# Patient Record
Sex: Female | Born: 1955 | Race: White | Hispanic: No | Smoking: Former smoker
Health system: Southern US, Community
[De-identification: ages and names within clinical notes are randomized; demographics above are authoritative.]

## PROBLEM LIST (undated history)

## (undated) DIAGNOSIS — I4891 Unspecified atrial fibrillation: Secondary | ICD-10-CM

## (undated) DIAGNOSIS — I10 Essential (primary) hypertension: Secondary | ICD-10-CM

## (undated) DIAGNOSIS — J40 Bronchitis, not specified as acute or chronic: Secondary | ICD-10-CM

## (undated) HISTORY — PX: TUBAL LIGATION: SHX77

## (undated) HISTORY — PX: OTHER SURGICAL HISTORY: SHX169

## (undated) HISTORY — PX: BREAST REDUCTION SURGERY: SHX8

---

## 1998-07-02 ENCOUNTER — Other Ambulatory Visit: Admission: RE | Admit: 1998-07-02 | Discharge: 1998-07-02 | Payer: Self-pay | Admitting: Obstetrics and Gynecology

## 1999-04-19 ENCOUNTER — Other Ambulatory Visit: Admission: RE | Admit: 1999-04-19 | Discharge: 1999-04-19 | Payer: Self-pay | Admitting: Obstetrics and Gynecology

## 2000-04-06 ENCOUNTER — Other Ambulatory Visit: Admission: RE | Admit: 2000-04-06 | Discharge: 2000-04-06 | Payer: Self-pay | Admitting: Obstetrics and Gynecology

## 2001-05-03 ENCOUNTER — Other Ambulatory Visit: Admission: RE | Admit: 2001-05-03 | Discharge: 2001-05-03 | Payer: Self-pay | Admitting: Obstetrics and Gynecology

## 2002-05-09 ENCOUNTER — Other Ambulatory Visit: Admission: RE | Admit: 2002-05-09 | Discharge: 2002-05-09 | Payer: Self-pay | Admitting: Obstetrics and Gynecology

## 2003-05-28 ENCOUNTER — Other Ambulatory Visit: Admission: RE | Admit: 2003-05-28 | Discharge: 2003-05-28 | Payer: Self-pay | Admitting: Obstetrics and Gynecology

## 2003-05-29 ENCOUNTER — Other Ambulatory Visit: Admission: RE | Admit: 2003-05-29 | Discharge: 2003-05-29 | Payer: Self-pay | Admitting: Obstetrics and Gynecology

## 2004-05-29 ENCOUNTER — Other Ambulatory Visit: Admission: RE | Admit: 2004-05-29 | Discharge: 2004-05-29 | Payer: Self-pay | Admitting: Obstetrics and Gynecology

## 2004-06-23 ENCOUNTER — Emergency Department (HOSPITAL_COMMUNITY): Admission: EM | Admit: 2004-06-23 | Discharge: 2004-06-23 | Payer: Self-pay | Admitting: Emergency Medicine

## 2005-05-30 ENCOUNTER — Other Ambulatory Visit: Admission: RE | Admit: 2005-05-30 | Discharge: 2005-05-30 | Payer: Self-pay | Admitting: Obstetrics and Gynecology

## 2006-06-08 ENCOUNTER — Other Ambulatory Visit: Admission: RE | Admit: 2006-06-08 | Discharge: 2006-06-08 | Payer: Self-pay | Admitting: Obstetrics and Gynecology

## 2008-03-04 ENCOUNTER — Emergency Department (HOSPITAL_COMMUNITY): Admission: EM | Admit: 2008-03-04 | Discharge: 2008-03-04 | Payer: Self-pay | Admitting: Emergency Medicine

## 2010-06-11 ENCOUNTER — Inpatient Hospital Stay (HOSPITAL_COMMUNITY)
Admission: EM | Admit: 2010-06-11 | Discharge: 2010-06-14 | DRG: 580 | Disposition: A | Discharge status: Home / Self Care | Attending: Orthopedic Surgery | Admitting: Orthopedic Surgery

## 2010-06-11 ENCOUNTER — Emergency Department (HOSPITAL_COMMUNITY)

## 2010-06-11 DIAGNOSIS — L03019 Cellulitis of unspecified finger: Principal | ICD-10-CM | POA: Diagnosis present

## 2010-06-11 DIAGNOSIS — M65839 Other synovitis and tenosynovitis, unspecified forearm: Secondary | ICD-10-CM | POA: Diagnosis present

## 2010-06-11 DIAGNOSIS — IMO0001 Reserved for inherently not codable concepts without codable children: Secondary | ICD-10-CM | POA: Diagnosis present

## 2010-06-11 DIAGNOSIS — L02519 Cutaneous abscess of unspecified hand: Principal | ICD-10-CM | POA: Diagnosis present

## 2010-06-11 DIAGNOSIS — I891 Lymphangitis: Secondary | ICD-10-CM | POA: Diagnosis present

## 2010-06-11 DIAGNOSIS — A280 Pasteurellosis: Secondary | ICD-10-CM | POA: Diagnosis present

## 2010-06-11 DIAGNOSIS — S61209A Unspecified open wound of unspecified finger without damage to nail, initial encounter: Secondary | ICD-10-CM | POA: Diagnosis present

## 2010-06-11 LAB — BASIC METABOLIC PANEL
CO2: 27 mEq/L (ref 19–32)
Chloride: 103 mEq/L (ref 96–112)
Creatinine, Ser: 0.93 mg/dL (ref 0.4–1.2)
GFR calc Af Amer: >60 mL/min (ref >60)
GFR calc non Af Amer: >60 mL/min (ref >60)
Potassium: 3.9 mEq/L (ref 3.5–5.1)

## 2010-06-11 LAB — CBC
HCT: 40.3 % (ref 36.0–46.0)
Hemoglobin: 14.2 g/dL (ref 12.0–15.0)
MCH: 29.7 pg (ref 26.0–34.0)
MCV: 84.3 fL (ref 78.0–100.0)
Platelets: 249 10*3/uL (ref 150–400)
WBC: 13.2 10*3/uL — ABNORMAL HIGH (ref 4.0–10.5)

## 2010-06-11 LAB — DIFFERENTIAL
Eosinophils Relative: 1 % (ref 0–5)
Lymphocytes Relative: 17 % (ref 12–46)
Monocytes Absolute: 1 10*3/uL (ref 0.1–1.0)
Monocytes Relative: 7 % (ref 3–12)
Neutrophils Relative %: 75 % (ref 43–77)

## 2010-06-12 LAB — BASIC METABOLIC PANEL
CO2: 25 mEq/L (ref 19–32)
Calcium: 8.7 mg/dL (ref 8.4–10.5)
Chloride: 105 mEq/L (ref 96–112)
Creatinine, Ser: 1.1 mg/dL (ref 0.4–1.2)
GFR calc Af Amer: >60 mL/min (ref >60)
Glucose, Bld: 181 mg/dL — ABNORMAL HIGH (ref 70–99)
Potassium: 3.9 mEq/L (ref 3.5–5.1)
Sodium: 137 mEq/L (ref 135–145)

## 2010-06-12 LAB — CBC
HCT: 37.2 % (ref 36.0–46.0)
Hemoglobin: 12.4 g/dL (ref 12.0–15.0)

## 2010-06-12 LAB — GRAM STAIN

## 2010-06-13 LAB — BASIC METABOLIC PANEL
CO2: 27 mEq/L (ref 19–32)
Calcium: 8.4 mg/dL (ref 8.4–10.5)
GFR calc Af Amer: >60 mL/min (ref >60)
Glucose, Bld: 128 mg/dL — ABNORMAL HIGH (ref 70–99)
Potassium: 3.9 mEq/L (ref 3.5–5.1)
Sodium: 142 mEq/L (ref 135–145)

## 2010-06-13 LAB — CBC
HCT: 37 % (ref 36.0–46.0)
Hemoglobin: 12.1 g/dL (ref 12.0–15.0)
MCHC: 32.7 g/dL (ref 30.0–36.0)
MCV: 86.4 fL (ref 78.0–100.0)

## 2010-06-14 LAB — WOUND CULTURE

## 2010-06-14 NOTE — Op Note (Signed)
  Brandi Stanton, Brandi Stanton              ACCOUNT NO.:  [id number]  MEDICAL RECORD NO.:  [id number]           PATIENT TYPE:  E  LOCATION:  [facility]                         FACILITY:  [facility]  PHYSICIAN:  Dionne Ano. Morayo Leven, M.D.DATE OF BIRTH:  April 20, 1956  DATE OF PROCEDURE:  06/11/2010 DATE OF DISCHARGE:                              OPERATIVE REPORT   PREOPERATIVE DIAGNOSIS:  Cat bite with purulent extensor tenosynovitis, right index finger.  POSTOPERATIVE DIAGNOSIS:  Cat bite with purulent extensor tenosynovitis, right index finger.  PROCEDURE: 1. Incision and drainage of deep abscess x2 including skin,     subcutaneous tissue and tendon sheath about the right index finger. 2. Extensor tenosynovectomy, extensive in nature, right index finger     and hand secondary to infection.  SURGEON:  Dionne Ano. Amanda Pea, MD  ASSISTANT:  None.  COMPLICATIONS:  None.  ANESTHESIA:  Intermetacarpal block.  TOURNIQUET TIME:  Less than an hour.  CULTURES:  One.  DRAINS:  Multiple.  INDICATIONS FOR PROCEDURE:  A very pleasant female with infected cat bite.  I have discussed the risks and benefits of surgery and she desires to proceed.  She has purulent extensor tenosynovitis, significant abnormality and ascending lymphangitis.  She been placed on ciprofloxacin 400 mg IV q.12 and Flagyl 500 mg q.8 h.  I have discussed the risks and benefits and she desires to proceed.  DESCRIPTION OF PROCEDURE:  The patient was given intermetacarpal block. Following this, prepped and draped in usual sterile fashion, Betadine scrub and paint x2, followed by Hibiclens scrub.  Once this was done, outline marks were made, I and D of 2 separate deep abscesses was accomplished.  Skin, subcutaneous tissue, tendon and tendon sheath tissue underwent I and D.  This was an excisional debridement with curette, knife, blade, and orthopedic instrument.  Following this, I then performed extensive extensor  tenosynovectomy secondary to inflamed tenosynovium.  The patient tolerated this well. This was removed without difficulty.  Cultures were taken.  Greater than 2 L of irrigant was placed through the wound and following this, it was packed with iodoform.  Tourniquet time was less than 30 minutes.  She had good refill at the conclusion of procedure, was dressed sterilely and will be admitted for IV antibiotics.  She will be on Cipro and Flagyl given the fact that she is pan-allergic.  We will also plan for elevation, finger range of motion, close observation and daily therapy for Water Pik and packing of the wound.  We are going to await cultures and place her on specific p.o. antibiotics according to the cultures as they return.  I have discussed with her relevant issues, do's and do not's, etc., and all questions have been encouraged and answered.     Dionne Ano. Amanda Pea, M.D.     [facility]  D:  06/11/2010  T:  06/12/2010  Job:  [id number]  Electronically Signed by Dominica Severin M.D. on 06/14/2010 05:54:51 AM

## 2011-10-04 ENCOUNTER — Emergency Department (HOSPITAL_COMMUNITY)
Admission: EM | Admit: 2011-10-04 | Discharge: 2011-10-04 | Disposition: A | Discharge status: Home / Self Care | Attending: Emergency Medicine | Admitting: Emergency Medicine

## 2011-10-04 ENCOUNTER — Encounter (HOSPITAL_COMMUNITY): Payer: Self-pay | Admitting: Emergency Medicine

## 2011-10-04 DIAGNOSIS — F17200 Nicotine dependence, unspecified, uncomplicated: Secondary | ICD-10-CM | POA: Insufficient documentation

## 2011-10-04 DIAGNOSIS — L259 Unspecified contact dermatitis, unspecified cause: Secondary | ICD-10-CM | POA: Insufficient documentation

## 2011-10-04 DIAGNOSIS — I10 Essential (primary) hypertension: Secondary | ICD-10-CM | POA: Insufficient documentation

## 2011-10-04 DIAGNOSIS — L309 Dermatitis, unspecified: Secondary | ICD-10-CM

## 2011-10-04 HISTORY — DX: Essential (primary) hypertension: I10

## 2011-10-04 MED ORDER — DIPHENHYDRAMINE HCL 25 MG PO CAPS
25.0000 mg | ORAL_CAPSULE | Freq: Once | ORAL | Status: AC
  Administered 2011-10-04: 25 mg via ORAL
  Filled 2011-10-04: qty 1

## 2011-10-04 MED ORDER — PREDNISONE 10 MG PO TABS: 40.0000 mg | ORAL_TABLET | Freq: Every day | ORAL | Status: DC

## 2011-10-04 MED ORDER — PREDNISONE 20 MG PO TABS
60.0000 mg | ORAL_TABLET | Freq: Once | ORAL | Status: AC
  Administered 2011-10-04: 60 mg via ORAL
  Filled 2011-10-04: qty 3

## 2011-10-04 MED ORDER — TRIAMCINOLONE ACETONIDE 0.1 % EX CREA: TOPICAL_CREAM | Freq: Two times a day (BID) | CUTANEOUS | Status: AC

## 2011-10-04 NOTE — ED Provider Notes (Signed)
Medical screening examination/treatment/procedure(s) were performed by non-physician practitioner and as supervising physician I was immediately available for consultation/collaboration.   Jahad Old, MD 10/04/11 1537 

## 2011-10-04 NOTE — ED Notes (Signed)
Pt reports  2 months hx of redness, itching over arms, groin area,behind knees. Pt has never been evaluated for this, reports same symptoms every April, Pt treated with topical creams only

## 2011-10-04 NOTE — ED Provider Notes (Signed)
History     CSN: [id number]  Arrival date & time 10/04/11  0847   First MD Initiated Contact with Patient 10/04/11 [phone]      Chief Complaint  Patient presents with  . Rash    red, raised itching rash in groin, arms, back of knees    (Consider location/radiation/quality/duration/timing/severity/associated sxs/prior treatment) HPI Comments: Patient reports she gets dermatitis every April x 20 years.  States she treats this at home with topical alcohol, vinegar, and betadine.  This year it has gotten much worse and has spread up both of her arms, involving mostly her antecubital fossae and axillae, it is also involving her hairline and neck, areas behind her knees and in her groin.  This has been present x 2 months.   Denies fever, chills, opening of the skin.  No pain involved.  No discharge.    The history is provided by the patient.    Past Medical History  Diagnosis Date  . Hypertension     Past Surgical History  Procedure Date  . Breast reduction surgery   . Other surgical history     LEAP procedure    Family History  Problem Relation Age of Onset  . Diabetes Mother   . Hypertension Mother     History  Substance Use Topics  . Smoking status: Current Everyday Smoker    Types: Cigarettes  . Smokeless tobacco: Not on file  . Alcohol Use: Yes    OB History    Grav Para Term Preterm Abortions TAB SAB Ect Mult Living                  Review of Systems  Constitutional: Negative for fever and chills.  HENT: Negative for sore throat and trouble swallowing.   Skin: Positive for rash. Negative for pallor and wound.    Allergies  Penicillins  Home Medications   Current Outpatient Rx  Name Route Sig Dispense Refill  . BIOTIN 5000 MCG PO TABS Oral Take 2 tablets by mouth daily.    Marland Kitchen CALCIUM CARBONATE-VITAMIN D 500-200 MG-UNIT PO TABS Oral Take 1 tablet by mouth daily.    . OMEGA-3 FATTY ACIDS 1000 MG PO CAPS Oral Take 2 g by mouth daily.    Marland Kitchen FOLIC ACID 800 MCG  PO TABS Oral Take 3,600 mcg by mouth daily.    . WOMENS 50+ ADVANCED PO CAPS Oral Take 1 capsule by mouth daily.      BP 182/95  Pulse 91  Temp(Src) 98.2 F (36.8 C) (Oral)  Resp 18  SpO2 99%  Physical Exam  Nursing note and vitals reviewed. Constitutional: She appears well-developed and well-nourished. No distress.  HENT:  Head: Normocephalic and atraumatic.  Neck: Neck supple.  Pulmonary/Chest: Effort normal.  Musculoskeletal: Normal range of motion. She exhibits no tenderness.       Distal pulses intact in all extremities.   Neurological: She is alert.  Skin: She is not diaphoretic.       Dry erythematous rash with lichenification extends over most flexor surfaces.  No warmth, discharge, tenderness.     ED Course  Procedures (including critical care time)  Labs Reviewed - No data to display No results found.   1. Dermatitis       MDM  Patient with diffuse dermatitis, likely eczema.  As she has annual flare up I suspect it is allergy related.  Pt also with what appears to be a cyst on her left foot, no signs of infection.  Pt given podiatry and derm follow up.  Pt d/c home with prednisone x 2 weeks, triamcinolone cream, benadryl for itching.  Return precautions given.  Patient verbalizes understanding and agrees with plan.         Dillard Cannon [place], [place] 10/04/11 [phone]

## 2011-10-04 NOTE — Discharge Instructions (Signed)
Read the information below.  If you develop fevers, pain, swelling, pus coming from your skin, return to the ER immediately for a recheck. You may return to the ER at any time for worsening condition or any new symptoms that concern you.  Rash A rash is a change in the color or texture of your skin. There are many different types of rashes. You may have other problems that accompany your rash. CAUSES   Infections.   Allergic reactions. This can include allergies to pets or foods.   Certain medicines.   Exposure to certain chemicals, soaps, or cosmetics.   Heat.   Exposure to poisonous plants.   Tumors, both cancerous and noncancerous.  SYMPTOMS   Redness.   Scaly skin.   Itchy skin.   Dry or cracked skin.   Bumps.   Blisters.   Pain.  DIAGNOSIS  Your caregiver may do a physical exam to determine what type of rash you have. A skin sample (biopsy) may be taken and examined under a microscope. TREATMENT  Treatment depends on the type of rash you have. Your caregiver may prescribe certain medicines. For serious conditions, you may need to see a skin doctor (dermatologist). HOME CARE INSTRUCTIONS   Avoid the substance that caused your rash.   Do not scratch your rash. This can cause infection.   You may take cool baths to help stop itching.   Only take over-the-counter or prescription medicines as directed by your caregiver.   Keep all follow-up appointments as directed by your caregiver.  SEEK IMMEDIATE MEDICAL CARE IF:  You have increasing pain, swelling, or redness.   You have a fever.   You have new or severe symptoms.   You have body aches, diarrhea, or vomiting.   Your rash is not better after 3 days.  MAKE SURE YOU:  Understand these instructions.   Will watch your condition.   Will get help right away if you are not doing well or get worse.  Document Released: 04/04/2002 Document Revised: 04/03/2011 Document Reviewed: 01/27/2011 [facility] Patient  Information 2012 [place], [place].  Eczema Atopic dermatitis, or eczema, is an inherited type of sensitive skin. Often people with eczema have a family history of allergies, asthma, or hay fever. It causes a red itchy rash and dry scaly skin. The itchiness may occur before the skin rash and may be very intense. It is not contagious. Eczema is generally worse during the cooler winter months and often improves with the warmth of summer. Eczema usually starts showing signs in infancy. Some children outgrow eczema, but it may last through adulthood. Flare-ups may be caused by:  Eating something or contact with something you are sensitive or allergic to.   Stress.  DIAGNOSIS  The diagnosis of eczema is usually based upon symptoms and medical history. TREATMENT  Eczema cannot be cured, but symptoms usually can be controlled with treatment or avoidance of allergens (things to which you are sensitive or allergic to).  Controlling the itching and scratching.   Use over-the-counter antihistamines as directed for itching. It is especially useful at night when the itching tends to be worse.   Use over-the-counter steroid creams as directed for itching.   Scratching makes the rash and itching worse and may cause impetigo (a skin infection) if fingernails are contaminated (dirty).   Keeping the skin well moisturized with creams every day. This will seal in moisture and help prevent dryness. Lotions containing alcohol and water can dry the skin and are not  recommended.   Limiting exposure to allergens.   Recognizing situations that cause stress.   Developing a plan to manage stress.  HOME CARE INSTRUCTIONS   Take prescription and over-the-counter medicines as directed by your caregiver.   Do not use anything on the skin without checking with your caregiver.   Keep baths or showers short (5 minutes) in warm (not hot) water. Use mild cleansers for bathing. You may add non-perfumed bath oil to the bath  water. It is best to avoid soap and bubble bath.   Immediately after a bath or shower, when the skin is still damp, apply a moisturizing ointment to the entire body. This ointment should be a petroleum ointment. This will seal in moisture and help prevent dryness. The thicker the ointment the better. These should be unscented.   Keep fingernails cut short and wash hands often. If your child has eczema, it may be necessary to put soft gloves or mittens on your child at night.   Dress in clothes made of cotton or cotton blends. Dress lightly, as heat increases itching.   Avoid foods that may cause flare-ups. Common foods include cow's milk, peanut butter, eggs and wheat.   Keep a child with eczema away from anyone with fever blisters. The virus that causes fever blisters (herpes simplex) can cause a serious skin infection in children with eczema.  SEEK MEDICAL CARE IF:   Itching interferes with sleep.   The rash gets worse or is not better within one week following treatment.   The rash looks infected (pus or soft yellow scabs).   You or your child has an oral temperature above 102 F (38.9 C).   Your baby is older than 3 months with a rectal temperature of 100.5 F (38.1 C) or higher for more than 1 day.   The rash flares up after contact with someone who has fever blisters.  SEEK IMMEDIATE MEDICAL CARE IF:   Your baby is older than 3 months with a rectal temperature of 102 F (38.9 C) or higher.   Your baby is older than 3 months or younger with a rectal temperature of 100.4 F (38 C) or higher.  Document Released: 04/11/2000 Document Revised: 04/03/2011 Document Reviewed: 02/14/2009 [facility] Patient Information 2012 [place], [place].Eczema Atopic dermatitis, or eczema, is an inherited type of sensitive skin. Often people with eczema have a family history of allergies, asthma, or hay fever. It causes a red itchy rash and dry scaly skin. The itchiness may occur before the skin rash  and may be very intense. It is not contagious. Eczema is generally worse during the cooler winter months and often improves with the warmth of summer. Eczema usually starts showing signs in infancy. Some children outgrow eczema, but it may last through adulthood. Flare-ups may be caused by:  Eating something or contact with something you are sensitive or allergic to.   Stress.  DIAGNOSIS  The diagnosis of eczema is usually based upon symptoms and medical history. TREATMENT  Eczema cannot be cured, but symptoms usually can be controlled with treatment or avoidance of allergens (things to which you are sensitive or allergic to).  Controlling the itching and scratching.   Use over-the-counter antihistamines as directed for itching. It is especially useful at night when the itching tends to be worse.   Use over-the-counter steroid creams as directed for itching.   Scratching makes the rash and itching worse and may cause impetigo (a skin infection) if fingernails are contaminated (dirty).  Keeping the skin well moisturized with creams every day. This will seal in moisture and help prevent dryness. Lotions containing alcohol and water can dry the skin and are not recommended.   Limiting exposure to allergens.   Recognizing situations that cause stress.   Developing a plan to manage stress.  HOME CARE INSTRUCTIONS   Take prescription and over-the-counter medicines as directed by your caregiver.   Do not use anything on the skin without checking with your caregiver.   Keep baths or showers short (5 minutes) in warm (not hot) water. Use mild cleansers for bathing. You may add non-perfumed bath oil to the bath water. It is best to avoid soap and bubble bath.   Immediately after a bath or shower, when the skin is still damp, apply a moisturizing ointment to the entire body. This ointment should be a petroleum ointment. This will seal in moisture and help prevent dryness. The thicker the  ointment the better. These should be unscented.   Keep fingernails cut short and wash hands often. If your child has eczema, it may be necessary to put soft gloves or mittens on your child at night.   Dress in clothes made of cotton or cotton blends. Dress lightly, as heat increases itching.   Avoid foods that may cause flare-ups. Common foods include cow's milk, peanut butter, eggs and wheat.   Keep a child with eczema away from anyone with fever blisters. The virus that causes fever blisters (herpes simplex) can cause a serious skin infection in children with eczema.  SEEK MEDICAL CARE IF:   Itching interferes with sleep.   The rash gets worse or is not better within one week following treatment.   The rash looks infected (pus or soft yellow scabs).   You or your child has an oral temperature above 102 F (38.9 C).   Your baby is older than 3 months with a rectal temperature of 100.5 F (38.1 C) or higher for more than 1 day.   The rash flares up after contact with someone who has fever blisters.  SEEK IMMEDIATE MEDICAL CARE IF:   Your baby is older than 3 months with a rectal temperature of 102 F (38.9 C) or higher.   Your baby is older than 3 months or younger with a rectal temperature of 100.4 F (38 C) or higher.  Document Released: 04/11/2000 Document Revised: 04/03/2011 Document Reviewed: 02/14/2009 [facility] Patient Information 2012 [place], [place].

## 2014-08-29 ENCOUNTER — Emergency Department (HOSPITAL_COMMUNITY)

## 2014-08-29 ENCOUNTER — Emergency Department (HOSPITAL_COMMUNITY)
Admission: EM | Admit: 2014-08-29 | Discharge: 2014-08-29 | Disposition: A | Discharge status: Home / Self Care | Attending: Emergency Medicine | Admitting: Emergency Medicine

## 2014-08-29 ENCOUNTER — Encounter (HOSPITAL_COMMUNITY): Payer: Self-pay | Admitting: Emergency Medicine

## 2014-08-29 DIAGNOSIS — Y998 Other external cause status: Secondary | ICD-10-CM | POA: Diagnosis not present

## 2014-08-29 DIAGNOSIS — Y9389 Activity, other specified: Secondary | ICD-10-CM | POA: Diagnosis not present

## 2014-08-29 DIAGNOSIS — Y9289 Other specified places as the place of occurrence of the external cause: Secondary | ICD-10-CM | POA: Insufficient documentation

## 2014-08-29 DIAGNOSIS — Z7952 Long term (current) use of systemic steroids: Secondary | ICD-10-CM | POA: Insufficient documentation

## 2014-08-29 DIAGNOSIS — I10 Essential (primary) hypertension: Secondary | ICD-10-CM | POA: Insufficient documentation

## 2014-08-29 DIAGNOSIS — Z79899 Other long term (current) drug therapy: Secondary | ICD-10-CM | POA: Diagnosis not present

## 2014-08-29 DIAGNOSIS — Z880 Allergy status to penicillin: Secondary | ICD-10-CM | POA: Insufficient documentation

## 2014-08-29 DIAGNOSIS — S82102A Unspecified fracture of upper end of left tibia, initial encounter for closed fracture: Secondary | ICD-10-CM | POA: Diagnosis not present

## 2014-08-29 DIAGNOSIS — Z720 Tobacco use: Secondary | ICD-10-CM | POA: Diagnosis not present

## 2014-08-29 DIAGNOSIS — W1839XA Other fall on same level, initial encounter: Secondary | ICD-10-CM | POA: Diagnosis not present

## 2014-08-29 DIAGNOSIS — S8992XA Unspecified injury of left lower leg, initial encounter: Secondary | ICD-10-CM | POA: Diagnosis present

## 2014-08-29 DIAGNOSIS — S82143A Displaced bicondylar fracture of unspecified tibia, initial encounter for closed fracture: Secondary | ICD-10-CM

## 2014-08-29 DIAGNOSIS — W19XXXA Unspecified fall, initial encounter: Secondary | ICD-10-CM

## 2014-08-29 MED ORDER — HYDROCODONE-ACETAMINOPHEN 5-325 MG PO TABS: 2.0000 | ORAL_TABLET | ORAL | Status: DC | PRN

## 2014-08-29 NOTE — ED Notes (Signed)
Pt reports stool tilted resulting in fall; injury to left knee. Pt reports weight bearing causes pain; swelling noted to left outer knee.

## 2014-08-29 NOTE — Progress Notes (Signed)
59 yr old [employer] pt with fall with injury to knee CM assessed for need of DME and pcp, specialist f/u  Pt has walked with crutches since she has been in the emergency room per ED CNA/RN Pt confirms she has [employer] coverage but does not have an insurance card only her military ID which her daughter present has with her (daughter not in the room) [facility] ED CM noted pt with coverage but no pcp listed [facility] ED CM spoke with pt on how to obtain an in network pcp and to check if any specialist assigned to her via EDP/PA/NP is in network with insurance coverage via the customer service number or web site  Cm reviewed ED level of care for crisis/emergent services and community pcp level of care to manage continuous or chronic medical concerns.  The pt voiced understanding CM encouraged pt and discussed pt's responsibility to verify with pt's insurance carrier that any recommended medical provider offered by any emergency room or a hospital provider is within the carrier's network. The pt voiced understanding

## 2014-08-29 NOTE — ED Notes (Signed)
Ortho tech applying knee immobilizer at present time.

## 2014-08-29 NOTE — ED Provider Notes (Signed)
CSN: [id number]     Arrival date & time 08/29/14  7829 History   First MD Initiated Contact with Patient 08/29/14 0840     Chief Complaint  Patient presents with  . Knee Injury      HPI  Patient presents with left knee pain and swelling. She was standing on a stool last night. She lost her balance. Her legs went to the right she came down onto the top of the stool. Has pain in the left knee laterally, more superiorly of the knee over the area of the femur. She wrapped it last night and took some ibuprofen. It was swollen this morning and she has a limp.  Past Medical History  Diagnosis Date  . Hypertension    Past Surgical History  Procedure Laterality Date  . Breast reduction surgery    . Other surgical history      LEAP procedure   Family History  Problem Relation Age of Onset  . Diabetes Mother   . Hypertension Mother    History  Substance Use Topics  . Smoking status: Current Every Day Smoker -- 1.00 packs/day    Types: Cigarettes  . Smokeless tobacco: Not on file  . Alcohol Use: Yes   OB History    No data available     Review of Systems  Musculoskeletal: Positive for arthralgias.       Pain and swelling to the left knee. No other areas of musculoskeletal pain or injury. Did not injure her upper extremity. No neck or back pain.      Allergies  Penicillins  Home Medications   Prior to Admission medications   Medication Sig Start Date End Date Taking? Authorizing Provider  BIOTIN PO Take 1 tablet by mouth daily.   Yes Historical Provider, MD  Capsicum, Cayenne, (CAYENNE PEPPER PO) Take 2 capsules by mouth daily.   Yes Historical Provider, MD  folic acid (FOLVITE) 800 MCG tablet Take 6 mcg by mouth daily.   Yes Historical Provider, MD  ibuprofen (ADVIL,MOTRIN) 200 MG tablet Take 400 mg by mouth every 6 (six) hours as needed for moderate pain.   Yes Historical Provider, MD  Multiple Vitamin (MULTIVITAMIN WITH MINERALS) TABS tablet Take 1 tablet by mouth daily.    Yes Historical Provider, MD  Omega-3 Fatty Acids (FISH OIL PO) Take 3 capsules by mouth daily.   Yes Historical Provider, MD  HYDROcodone-acetaminophen (NORCO/VICODIN) 5-325 MG per tablet Take 2 tablets by mouth every 4 (four) hours as needed. 08/29/14   Rolland Porter, MD  predniSONE (DELTASONE) 10 MG tablet Take 4 tablets (40 mg total) by mouth daily. Patient not taking: Reported on 08/29/2014 10/04/11   Trixie Dredge, PA-C   BP 160/77 mmHg  Pulse 78  Temp(Src) 97.6 F (36.4 C) (Oral)  Resp 18  SpO2 100%  LMP 04/29/2007 Physical Exam  Musculoskeletal:       Legs: Palpable ballottement of the patella consistent with joint effusion. Not tender directly over the lateral collateral ligament. Normal Lachman's testing without instability. Nontender over the patella. Extensor mechanism intact.    ED Course  Procedures (including critical care time) Labs Review Labs Reviewed - No data to display  Imaging Review Ct Knee Left Wo Contrast  08/29/2014   CLINICAL DATA:  Fall yesterday. Injury to the LEFT knee. Knee fracture.  EXAM: CT OF THE LEFT KNEE WITHOUT CONTRAST  TECHNIQUE: Multidetector CT imaging of the LEFT knee was performed according to the standard protocol. Multiplanar CT image reconstructions were  also generated.  COMPARISON:  Plain films earlier today.  FINDINGS: Nondepressed posterior lateral tibial plateau corner fracture is present. Fracture is visible on all imaging planes. Lipohemarthrosis of the joint is present associated with the fracture. Proximal fibula is intact. 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 Central osteophytes in the weight-bearing medial femoral condyle. Mild patellofemoral osteoarthritis. No meniscal entrapment. Small loose bodies are present in the anterior and posterior intercondylar notch.  Hemorrhage is present in a moderate size Baker's cyst in the medial popliteal fossa.  IMPRESSION: Nondepressed/nondisplaced posterior lateral tibial plateau  fracture.   Electronically Signed   By: Andreas Newport M.D.   On: 08/29/2014 11:49   Dg Knee Complete 4 Views Left  08/29/2014   CLINICAL DATA:  Patient fell yesterday, with pain. Pain is worse with weight-bearing.  EXAM: LEFT KNEE - COMPLETE 4+ VIEW  COMPARISON:  None.  FINDINGS: Large joint effusion. Suspected lateral tibial plateau fracture, seen only one of the oblique images, with up to 2 mm depression. Tricompartmental degenerative change. Loose body. No dislocation.  IMPRESSION: Suspected acute lateral tibial plateau fracture. Large joint effusion.   Electronically Signed   By: Davonna Belling M.D.   On: 08/29/2014 09:45     EKG Interpretation None      MDM   Final diagnoses:  Fall  Tibial plateau fracture    X-ray suggests, and CT confirms nondisplaced lateral posterior tibial plateau fracture. I discussed the case with Dr.Steve Lucey orthopedics. Patient will be seen in his office at a Thursday 1:00 appointment, 48 hours from now. Nonweightbearing with crutches. Fitted with immobilizer crutches here. All questions answered.    Rolland Porter, MD 08/29/14 1214

## 2014-08-29 NOTE — Discharge Instructions (Signed)
Nonweightbearing, using her crutches, to your appointment with Dr. Sherlean Foot on Thursday.   Tibial Fracture A tibial fracture is a break in your tibia bone. The tibia is the large shinbone in your lower leg. The bone will be held in place with a cast or splint until it is healed. HOME CARE  Put ice on the injured area.  Put ice in a plastic bag.  Place a towel between your skin and the bag.  Leave the ice on for 15-20 minutes, 03-04 times a day, for 2 days.  If you have a cast:  Do not scratch under the cast.  Check the skin around the cast every day. You may put lotion on any red or sore areas.  Keep your cast dry and clean.  If you have a splint:  Wear the splint as told by your doctor.  Loosen the elastic around the splint if your toes get numb, tingle, or turn cold or blue.  Do not put pressure on the cast or splint until it is hard.  Do not put the cast or splint in water. Cover it with a plastic bag when bathing.  Use crutches as told by your doctor.  Only take medicine as told by your doctor.  See your doctor as told for follow-up visits. GET HELP RIGHT AWAY IF:  Pain gets worse or is not controlled with medicine.  Puffiness (swelling) or redness gets worse.  You start to lose feeling in your foot or toes.  Your foot or toes get cold or blue.  You have pain in your leg, especially if it gets worse when you move your toes. MAKE SURE YOU:  Understand these instructions.  Will watch your condition.  Will get help right away if you are not doing well or get worse. Document Released: 05/17/2010 Document Revised: 07/07/2011 Document Reviewed: 06/08/2013 [facility] Patient Information 2015 [place], [place]. This information is not intended to replace advice given to you by your health care provider. Make sure you discuss any questions you have with your health care provider.

## 2014-08-31 DIAGNOSIS — S82142A Displaced bicondylar fracture of left tibia, initial encounter for closed fracture: Secondary | ICD-10-CM | POA: Insufficient documentation

## 2015-10-13 IMAGING — CT CT KNEE*L* W/O CM
5 of 7 series · 14 of 33 positions shown, 15 images · non-contrast
Comparison: Plain films earlier today.

CLINICAL DATA: Fall yesterday. Injury to the LEFT knee. Knee
fracture.

EXAM:
CT OF THE LEFT KNEE WITHOUT CONTRAST
TECHNIQUE: Multidetector CT imaging of the LEFT knee was performed according to
the standard protocol. Multiplanar CT image reconstructions were
also generated.

[Series 4: knee bone windows · axial · 0.36mm/px · z∈[+1238,+1298]mm · 2 of 91 slices shown]
[im 31/91  bone]
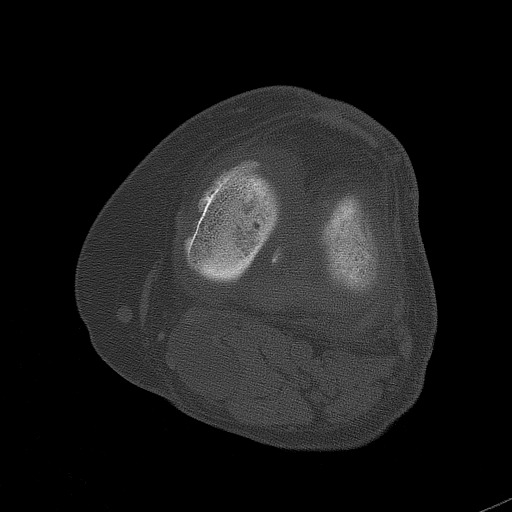
[im 61/91  bone]
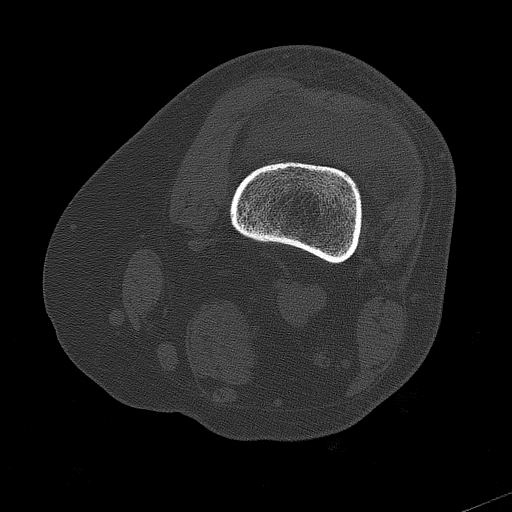

[Series 5: knee st · axial · 0.36mm/px · z∈[+1222,+1312]mm · 3 of 91 slices shown]
[im 23/91  bone]
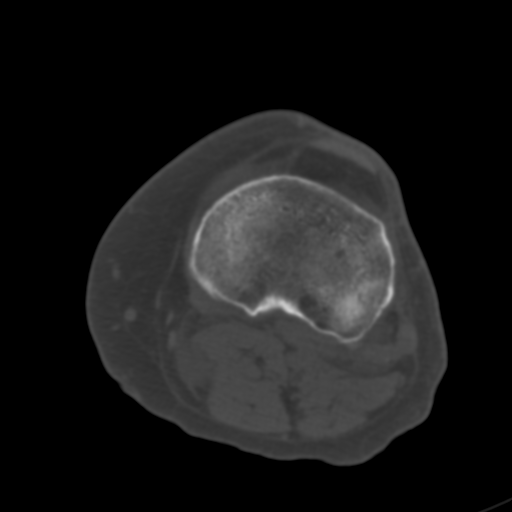
[im 46/91  bone]
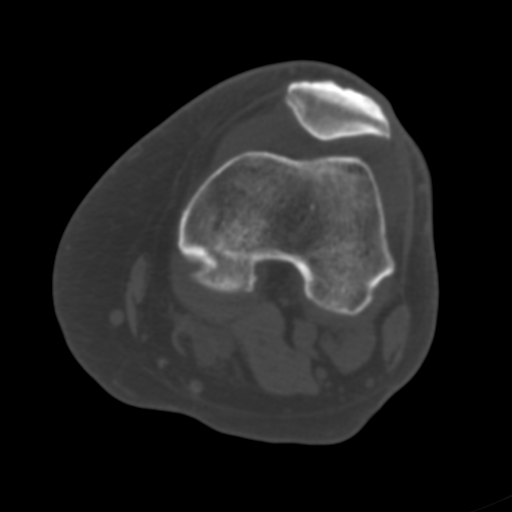
[im 68/91  bone]
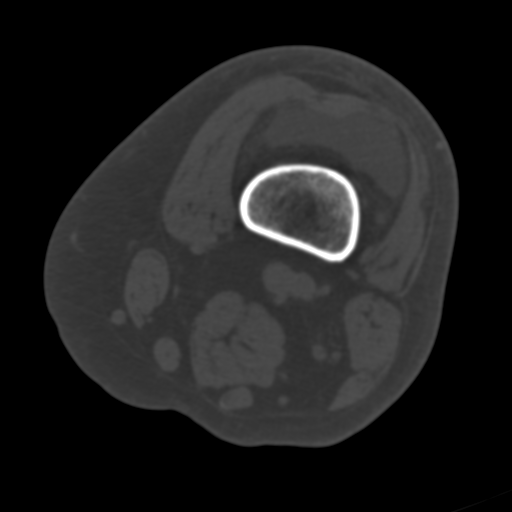

[Series 6: coronal bone · coronal · 0.35mm/px · 1 of 91 slices shown]
[im 46/91  bone]
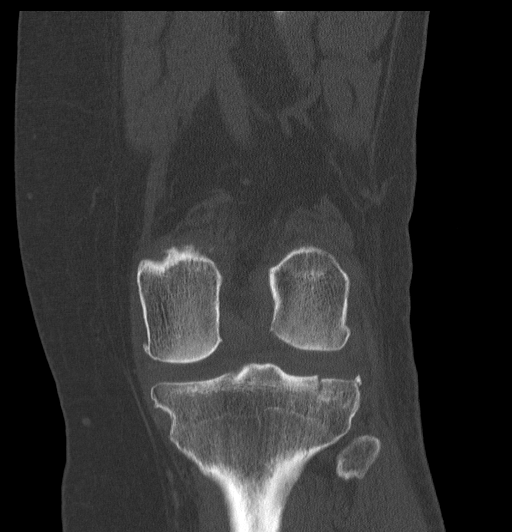

[Series 7: sagittal bone · sagittal · 0.35mm/px · 5 of 76 slices shown]
[im 13/76  bone]
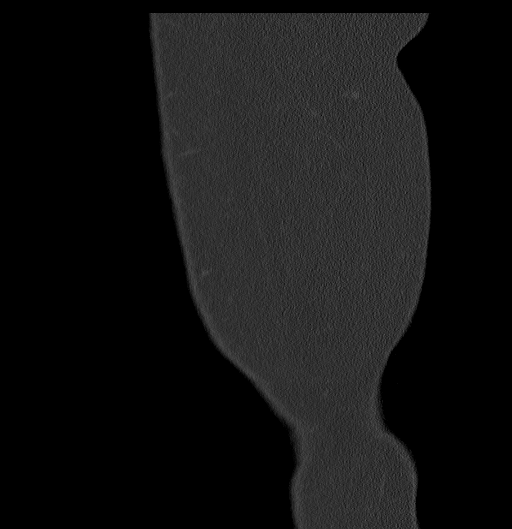
[im 26/76  bone]
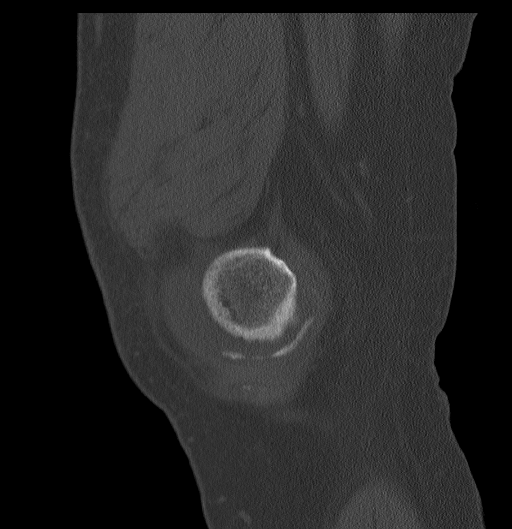
[im 38/76  bone]
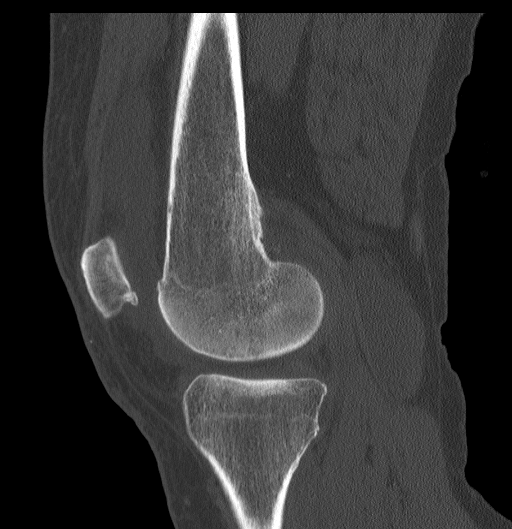
[im 51/76  bone]
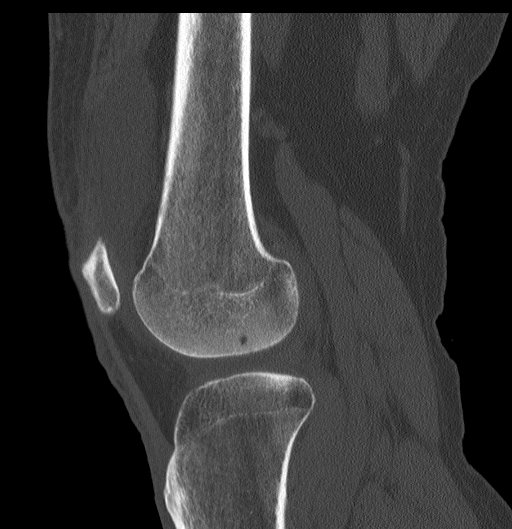
[im 63/76  bone]
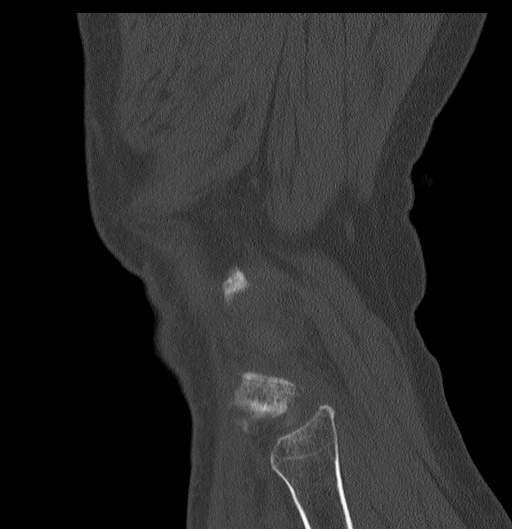

[Series 8: axial bone · axial · 0.29mm/px · z∈[+1219,+1314]mm · 3 of 96 slices shown, 4 images]
[im 24/96  soft-tissue]
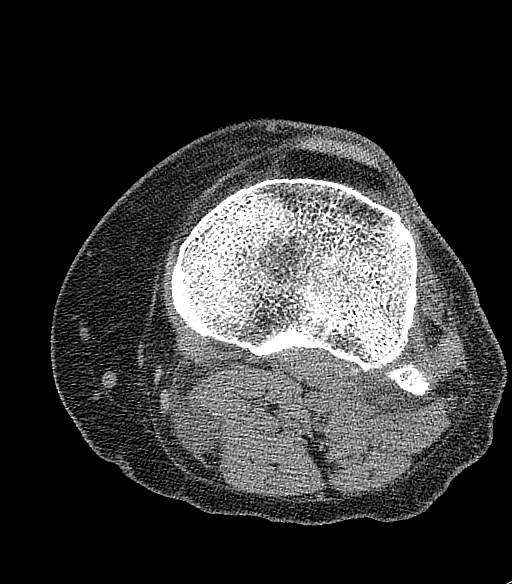
[im 24/96  bone]
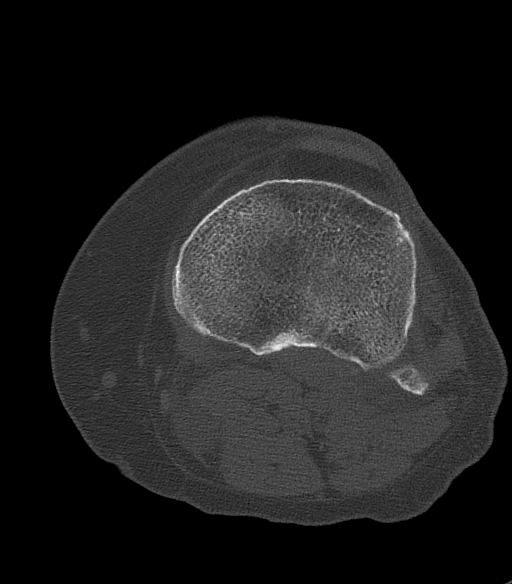
[im 48/96  bone]
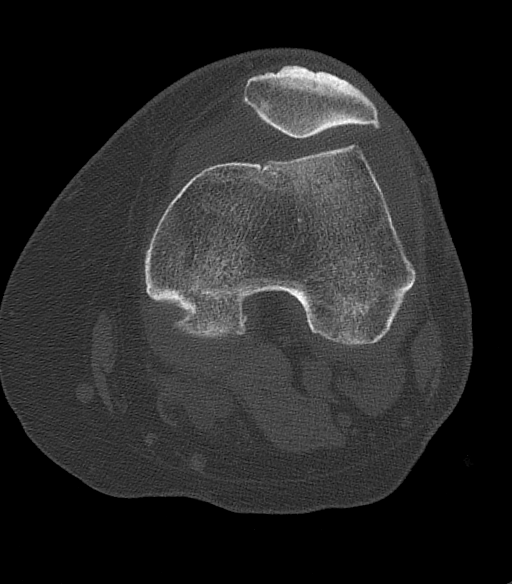
[im 72/96  bone]
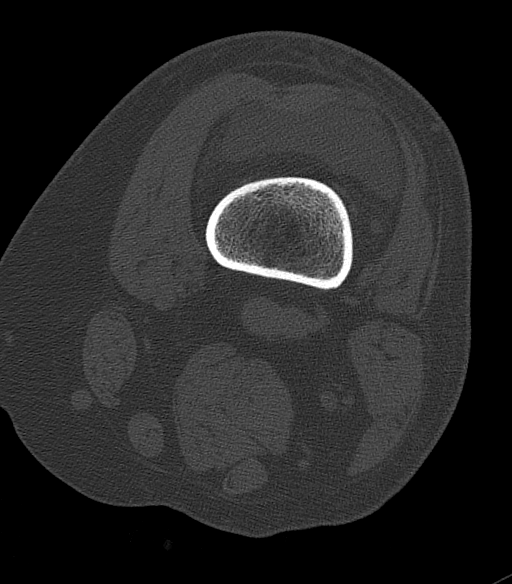

[14 of 33 positions shown; findings below may reference images not displayed]

FINDINGS: Nondepressed posterior lateral tibial plateau corner fracture is
present. Fracture is visible on all imaging planes. Lipohemarthrosis
of the joint is present associated with the fracture. Proximal
fibula is intact. Tricompartmental osteoarthritis is present which
is moderate. Central osteophytes in the weight-bearing medial
femoral condyle. Mild patellofemoral osteoarthritis. No meniscal
entrapment. Small loose bodies are present in the anterior and
posterior intercondylar notch.

Hemorrhage is present in a moderate size Baker's cyst in the medial
popliteal fossa.
IMPRESSION: Nondepressed/nondisplaced posterior lateral tibial plateau fracture.

## 2021-11-07 ENCOUNTER — Emergency Department (HOSPITAL_COMMUNITY): Payer: Medicare Other

## 2021-11-07 ENCOUNTER — Other Ambulatory Visit: Payer: Self-pay

## 2021-11-07 ENCOUNTER — Encounter (HOSPITAL_COMMUNITY): Payer: Self-pay | Admitting: Emergency Medicine

## 2021-11-07 ENCOUNTER — Inpatient Hospital Stay (HOSPITAL_COMMUNITY)
Admission: EM | Admit: 2021-11-07 | Discharge: 2021-11-12 | DRG: 291 | Disposition: A | Discharge status: Home / Self Care | Payer: Medicare Other | Attending: Family Medicine | Admitting: Family Medicine

## 2021-11-07 DIAGNOSIS — R7989 Other specified abnormal findings of blood chemistry: Principal | ICD-10-CM

## 2021-11-07 DIAGNOSIS — Z720 Tobacco use: Secondary | ICD-10-CM | POA: Diagnosis present

## 2021-11-07 DIAGNOSIS — J209 Acute bronchitis, unspecified: Secondary | ICD-10-CM | POA: Insufficient documentation

## 2021-11-07 DIAGNOSIS — I429 Cardiomyopathy, unspecified: Secondary | ICD-10-CM | POA: Diagnosis present

## 2021-11-07 DIAGNOSIS — Z8249 Family history of ischemic heart disease and other diseases of the circulatory system: Secondary | ICD-10-CM

## 2021-11-07 DIAGNOSIS — I509 Heart failure, unspecified: Secondary | ICD-10-CM | POA: Diagnosis not present

## 2021-11-07 DIAGNOSIS — E876 Hypokalemia: Secondary | ICD-10-CM

## 2021-11-07 DIAGNOSIS — I4891 Unspecified atrial fibrillation: Secondary | ICD-10-CM | POA: Diagnosis not present

## 2021-11-07 DIAGNOSIS — E038 Other specified hypothyroidism: Secondary | ICD-10-CM | POA: Insufficient documentation

## 2021-11-07 DIAGNOSIS — I5043 Acute on chronic combined systolic (congestive) and diastolic (congestive) heart failure: Secondary | ICD-10-CM | POA: Diagnosis present

## 2021-11-07 DIAGNOSIS — E669 Obesity, unspecified: Secondary | ICD-10-CM | POA: Diagnosis present

## 2021-11-07 DIAGNOSIS — F17210 Nicotine dependence, cigarettes, uncomplicated: Secondary | ICD-10-CM | POA: Diagnosis present

## 2021-11-07 DIAGNOSIS — E039 Hypothyroidism, unspecified: Secondary | ICD-10-CM

## 2021-11-07 DIAGNOSIS — I10 Essential (primary) hypertension: Secondary | ICD-10-CM | POA: Diagnosis present

## 2021-11-07 DIAGNOSIS — E8770 Fluid overload, unspecified: Secondary | ICD-10-CM | POA: Diagnosis present

## 2021-11-07 DIAGNOSIS — Z880 Allergy status to penicillin: Secondary | ICD-10-CM

## 2021-11-07 DIAGNOSIS — R0609 Other forms of dyspnea: Secondary | ICD-10-CM | POA: Diagnosis not present

## 2021-11-07 DIAGNOSIS — Z833 Family history of diabetes mellitus: Secondary | ICD-10-CM

## 2021-11-07 DIAGNOSIS — E66811 Obesity, class 1: Secondary | ICD-10-CM | POA: Diagnosis present

## 2021-11-07 DIAGNOSIS — E8779 Other fluid overload: Secondary | ICD-10-CM | POA: Diagnosis not present

## 2021-11-07 DIAGNOSIS — J9601 Acute respiratory failure with hypoxia: Secondary | ICD-10-CM | POA: Diagnosis present

## 2021-11-07 DIAGNOSIS — I5023 Acute on chronic systolic (congestive) heart failure: Secondary | ICD-10-CM | POA: Diagnosis present

## 2021-11-07 DIAGNOSIS — I110 Hypertensive heart disease with heart failure: Secondary | ICD-10-CM | POA: Diagnosis not present

## 2021-11-07 DIAGNOSIS — Z6831 Body mass index (BMI) 31.0-31.9, adult: Secondary | ICD-10-CM

## 2021-11-07 DIAGNOSIS — I4819 Other persistent atrial fibrillation: Secondary | ICD-10-CM | POA: Diagnosis present

## 2021-11-07 DIAGNOSIS — J9600 Acute respiratory failure, unspecified whether with hypoxia or hypercapnia: Secondary | ICD-10-CM | POA: Diagnosis present

## 2021-11-07 DIAGNOSIS — Z79899 Other long term (current) drug therapy: Secondary | ICD-10-CM

## 2021-11-07 LAB — BASIC METABOLIC PANEL
Anion gap: 7 (ref 5–15)
BUN: 15 mg/dL (ref 8–23)
CO2: 23 mmol/L (ref 22–32)
Calcium: 8.4 mg/dL — ABNORMAL LOW (ref 8.9–10.3)
Chloride: 108 mmol/L (ref 98–111)
Creatinine, Ser: 0.81 mg/dL (ref 0.44–1.00)
GFR, Estimated: >60 mL/min (ref >60)
Glucose, Bld: 107 mg/dL — ABNORMAL HIGH (ref 70–99)
Potassium: 4.1 mmol/L (ref 3.5–5.1)
Sodium: 138 mmol/L (ref 135–145)

## 2021-11-07 LAB — CBC WITH DIFFERENTIAL/PLATELET
Abs Immature Granulocytes: 0.02 10*3/uL (ref 0.00–0.07)
Basophils Absolute: 0.1 10*3/uL (ref 0.0–0.1)
Basophils Relative: 1 %
Eosinophils Absolute: 0.2 10*3/uL (ref 0.0–0.5)
Eosinophils Relative: 3 %
HCT: 43.9 % (ref 36.0–46.0)
Hemoglobin: 14.5 g/dL (ref 12.0–15.0)
Immature Granulocytes: 0 %
Lymphocytes Relative: 25 %
Lymphs Abs: 1.8 10*3/uL (ref 0.7–4.0)
MCH: 29.8 pg (ref 26.0–34.0)
MCHC: 33 g/dL (ref 30.0–36.0)
MCV: 90.1 fL (ref 80.0–100.0)
Monocytes Absolute: 0.5 10*3/uL (ref 0.1–1.0)
Monocytes Relative: 7 %
Neutro Abs: 4.6 10*3/uL (ref 1.7–7.7)
Neutrophils Relative %: 64 %
Platelets: 219 10*3/uL (ref 150–400)
RBC: 4.87 MIL/uL (ref 3.87–5.11)
RDW: 15.4 % (ref 11.5–15.5)
WBC: 7.2 10*3/uL (ref 4.0–10.5)
nRBC: 0 % (ref 0.0–0.2)

## 2021-11-07 LAB — BRAIN NATRIURETIC PEPTIDE: B Natriuretic Peptide: 237.2 pg/mL — ABNORMAL HIGH (ref 0.0–100.0)

## 2021-11-07 LAB — TROPONIN I (HIGH SENSITIVITY)
Troponin I (High Sensitivity): 3 ng/L (ref <18)
Troponin I (High Sensitivity): 3 ng/L (ref <18)

## 2021-11-07 MED ORDER — ACETAMINOPHEN 325 MG PO TABS: 650.0000 mg | ORAL_TABLET | Freq: Four times a day (QID) | ORAL | Status: DC | PRN

## 2021-11-07 MED ORDER — NICOTINE 21 MG/24HR TD PT24
21.0000 mg | MEDICATED_PATCH | Freq: Every day | TRANSDERMAL | Status: DC | PRN
Start: 2021-11-07 — End: 2021-11-12

## 2021-11-07 MED ORDER — LEVALBUTEROL HCL 1.25 MG/0.5ML IN NEBU
1.2500 mg | INHALATION_SOLUTION | Freq: Three times a day (TID) | RESPIRATORY_TRACT | Status: DC
Start: 2021-11-08 — End: 2021-11-08
  Administered 2021-11-08: 1.25 mg via RESPIRATORY_TRACT
  Filled 2021-11-07: qty 0.5

## 2021-11-07 MED ORDER — FUROSEMIDE 10 MG/ML IJ SOLN
40.0000 mg | Freq: Two times a day (BID) | INTRAMUSCULAR | Status: DC
  Administered 2021-11-07: 40 mg via INTRAVENOUS
  Filled 2021-11-07: qty 4

## 2021-11-07 MED ORDER — DILTIAZEM HCL 25 MG/5ML IV SOLN
10.0000 mg | Freq: Once | INTRAVENOUS | Status: AC
  Administered 2021-11-07: 10 mg via INTRAVENOUS
  Filled 2021-11-07: qty 5

## 2021-11-07 MED ORDER — METHYLPREDNISOLONE SODIUM SUCC 40 MG IJ SOLR
40.0000 mg | Freq: Once | INTRAMUSCULAR | Status: AC
  Administered 2021-11-07: 40 mg via INTRAVENOUS
  Filled 2021-11-07: qty 1

## 2021-11-07 MED ORDER — METOPROLOL TARTRATE 25 MG PO TABS
25.0000 mg | ORAL_TABLET | Freq: Two times a day (BID) | ORAL | Status: DC
  Administered 2021-11-07 – 2021-11-11 (×10): 25 mg via ORAL
  Filled 2021-11-07 (×10): qty 1

## 2021-11-07 MED ORDER — ACETAMINOPHEN 650 MG RE SUPP: 650.0000 mg | Freq: Four times a day (QID) | RECTAL | Status: DC | PRN

## 2021-11-07 MED ORDER — APIXABAN 5 MG PO TABS
5.0000 mg | ORAL_TABLET | Freq: Two times a day (BID) | ORAL | Status: DC
  Administered 2021-11-07 – 2021-11-12 (×10): 5 mg via ORAL
  Filled 2021-11-07 (×10): qty 1

## 2021-11-07 MED ORDER — LISINOPRIL 5 MG PO TABS
2.5000 mg | ORAL_TABLET | Freq: Every day | ORAL | Status: DC
  Administered 2021-11-07 – 2021-11-11 (×5): 2.5 mg via ORAL
  Filled 2021-11-07 (×6): qty 1

## 2021-11-07 MED ORDER — FUROSEMIDE 10 MG/ML IJ SOLN
40.0000 mg | Freq: Once | INTRAMUSCULAR | Status: AC
  Administered 2021-11-07: 40 mg via INTRAVENOUS
  Filled 2021-11-07: qty 4

## 2021-11-07 MED ORDER — LEVALBUTEROL HCL 1.25 MG/0.5ML IN NEBU
1.2500 mg | INHALATION_SOLUTION | Freq: Four times a day (QID) | RESPIRATORY_TRACT | Status: DC
Start: 2021-11-07 — End: 2021-11-07
  Administered 2021-11-07 (×2): 1.25 mg via RESPIRATORY_TRACT
  Filled 2021-11-07 (×2): qty 0.5

## 2021-11-07 MED ORDER — IPRATROPIUM BROMIDE 0.02 % IN SOLN
0.5000 mg | Freq: Three times a day (TID) | RESPIRATORY_TRACT | Status: DC
  Administered 2021-11-08: 0.5 mg via RESPIRATORY_TRACT
  Filled 2021-11-07: qty 2.5

## 2021-11-07 MED ORDER — ONDANSETRON HCL 4 MG/2ML IJ SOLN: 4.0000 mg | Freq: Four times a day (QID) | INTRAMUSCULAR | Status: DC | PRN

## 2021-11-07 MED ORDER — ONDANSETRON HCL 4 MG PO TABS: 4.0000 mg | ORAL_TABLET | Freq: Four times a day (QID) | ORAL | Status: DC | PRN

## 2021-11-07 NOTE — H&P (Signed)
History and Physical    Patient: Brandi Stanton YIR:[id number] DOB: August 23, 1955 DOA: 11/07/2021 DOS: the patient was seen and examined on 11/07/2021 PCP: Pcp, No  Patient coming from: Home  Chief Complaint:  Chief Complaint  Patient presents with   Shortness of Breath   HPI: Brandi Stanton is a 66 y.o. female with medical history significant of untreated hypertension, class I obesity, tobacco use who is coming to the emergency department due to progressively worse dyspnea associated with wheezing, productive cough with yellowish sputum, right-sided lower chest wall pleuritic pain, fatigue, malaise and orthopnea for the past 4 days.  She has been sleeping in a recliner.  She denied fever, chills, rhinorrhea, sore throat or hemoptysis.  No chest pain, palpitations, diaphoresis, PND or pitting edema of the lower extremities.  She has been mildly nauseous, but no abdominal pain, emesis, diarrhea, constipation, melena or hematochezia.  No flank pain, dysuria, frequency or hematuria.  No polyuria, polydipsia, polyphagia or blurred vision.   ED course: Initial vital signs were temperature 97.6 F, pulse 60, respiration 18, BP 171/118 mmHg O2 sat 94% on room air.  The patient received Cardizem 10 mg IVP and furosemide 40 mg IVP.  She was seen by cardiology who started her on furosemide 40 mg IVP twice daily and metoprolol 25 mg p.o. twice daily.  I added methylprednisolone 40 mg IVP and lisinopril 2.5 mg p.o. daily.  Lab work: Her CBC was normal.  Troponin x2 was 3 ng/L.  BNP 237.2 pg/mL.  BMP showed a glucose of 107 and calcium of 8.4 mg/dL.  The rest of the electrolytes and renal function were normal.  Imaging: Two-view chest radiograph showed bilateral interstitial opacities.  There is borderline cardiomegaly.   Review of Systems: As mentioned in the history of present illness. All other systems reviewed and are negative.  Past Medical History:  Diagnosis Date   Hypertension    Past Surgical  History:  Procedure Laterality Date   BREAST REDUCTION SURGERY     OTHER SURGICAL HISTORY     LEAP procedure   Social History:  reports that she has been smoking cigarettes. She has been smoking an average of 1 pack per day. She does not have any smokeless tobacco history on file. She reports current alcohol use. No history on file for drug use.  Allergies  Allergen Reactions   Penicillins Anaphylaxis    Family History  Problem Relation Age of Onset   Diabetes Mother    Hypertension Mother    Rheumatic fever Father    Heart disease Father        Secondary to rheumatic fever.    Prior to Admission medications   Medication Sig Start Date End Date Taking? Authorizing Provider  BIOTIN PO Take 1 tablet by mouth daily.    [provider]  Capsicum, Cayenne, (CAYENNE PEPPER PO) Take 2 capsules by mouth daily.    [provider]  folic acid (FOLVITE) 800 MCG tablet Take 6 mcg by mouth daily.    [provider]  HYDROcodone-acetaminophen (NORCO/VICODIN) 5-325 MG per tablet Take 2 tablets by mouth every 4 (four) hours as needed. 08/29/14   Rolland Porter, MD  ibuprofen (ADVIL,MOTRIN) 200 MG tablet Take 400 mg by mouth every 6 (six) hours as needed for moderate pain.    [provider]  Multiple Vitamin (MULTIVITAMIN WITH MINERALS) TABS tablet Take 1 tablet by mouth daily.    [provider]  Omega-3 Fatty Acids (FISH OIL PO) Take  3 capsules by mouth daily.    [provider]  predniSONE (DELTASONE) 10 MG tablet Take 4 tablets (40 mg total) by mouth daily. Patient not taking: Reported on 08/29/2014 10/04/11   Trixie Dredge, [place]    Physical Exam: Vitals:   11/07/21 1515 11/07/21 1542 11/07/21 1545 11/07/21 1630  BP: (!) 145/124 (!) 161/99 (!) 136/94   Pulse: 98 (!) 103 (!) 111 89  Resp: 20  (!) 25 18  Temp:      TempSrc:      SpO2: 97%  100% 100%  Weight:      Height:       Physical Exam Vitals and nursing note reviewed.   Constitutional:      General: She is awake.     Appearance: She is obese.     Interventions: Face mask in place.  HENT:     Head: Normocephalic and atraumatic.     Nose: No rhinorrhea.     Mouth/Throat:     Mouth: Mucous membranes are dry.  Eyes:     General: No scleral icterus.    Pupils: Pupils are equal, round, and reactive to light.  Neck:     Vascular: No JVD.  Cardiovascular:     Rate and Rhythm: Normal rate. Rhythm irregular.     Heart sounds: S1 normal and S2 normal.  Pulmonary:     Breath sounds: Decreased breath sounds, wheezing and rales present. No rhonchi.  Abdominal:     General: Bowel sounds are normal.     Palpations: Abdomen is soft.     Tenderness: There is no abdominal tenderness. There is no right CVA tenderness or left CVA tenderness.  Musculoskeletal:     Cervical back: Neck supple.     Right lower leg: No edema.     Left lower leg: No edema.  Skin:    General: Skin is warm and dry.  Neurological:     General: No focal deficit present.     Mental Status: She is alert and oriented to person, place, and time.  Psychiatric:        Mood and Affect: Mood normal.        Behavior: Behavior normal. Behavior is cooperative.   Data Reviewed:  There are no new results to review at this time.  Assessment and Plan: Principal Problem:   Acute respiratory failure (HCC) In the setting of:   Acute bronchitis May have undiagnosed COPD. Continue BiPAP ventilation. Methylprednisolone 40 mg IVP x1. Smoking cessation advised.    Volume overload Observation/telemetry. Supplemental oxygen as needed. Sodium and fluid restriction. Continue furosemide 40 mg IVP twice daily. Monitor daily weights, intake and output. Monitor renal function electrolytes. Check echocardiogram. Cardiology consult appreciated.  Active Problems:   Atrial fibrillation with RVR (HCC) CHA2DS2-VASc Score of at least 4. Continue apixaban 5 mg p.o. twice daily. Continue metoprolol for  rate control. As needed diltiazem for RVR.    Uncontrolled hypertension Started on metoprolol twice daily. Started on low-dose ACE inhibitor. Currently on IV furosemide twice daily. Monitor BP, heart rate, renal function electrolytes.    Class 1 obesity Lifestyle modifications. Follow-up with PCP.    Hypocalcemia Recheck calcium with albumin level in AM.    Tobacco use Tobacco cessation encouraged. Nicotine replacement therapy offered. NicoDerm ordered as needed.      Advance Care Planning:   Code Status: Full Code   Consults: Zoila Shutter, MD  Family Communication:   Severity of Illness: The appropriate patient status for  this patient is OBSERVATION. Observation status is judged to be reasonable and necessary in order to provide the required intensity of service to ensure the patient's safety. The patient's presenting symptoms, physical exam findings, and initial radiographic and laboratory data in the context of their medical condition is felt to place them at decreased risk for further clinical deterioration. Furthermore, it is anticipated that the patient will be medically stable for discharge from the hospital within 2 midnights of admission.   Author: Bobette Mo, MD 11/07/2021 5:40 PM  For on call review [email or web address].   This document was prepared using Dragon voice recognition software and may contain some unintended transcription errors.

## 2021-11-07 NOTE — ED Provider Notes (Signed)
[facility]-EMERGENCY DEPT Provider Note   CSN: [id number] Arrival date & time: 11/07/21  0906     History  Chief Complaint  Patient presents with   Shortness of Breath    Brandi Stanton is a 66 y.o. female.  Patient presents the hospital complaining of 4 days of cough.  Patient states that she began coughing and has been having rib pain due to the cough.  Patient also complains of worsening dyspnea on exertion.  She denies fevers, chest pain, abdominal pain, nausea, vomiting, urinary symptoms, headache.  Patient does state that she is a current every day smoker but over the past 4 days she has only had 5 cigarettes.  Patient has a 15-year pack history of smoking.  Patient takes no prescription medications and states that she has not seen a primary care provider in many years due to not being sick.  Past medical history significant for hypertension.  HPI     Home Medications Prior to Admission medications   Medication Sig Start Date End Date Taking? Authorizing Provider  BIOTIN PO Take 1 tablet by mouth daily.    [provider]  Capsicum, Cayenne, (CAYENNE PEPPER PO) Take 2 capsules by mouth daily.    [provider]  folic acid (FOLVITE) 800 MCG tablet Take 6 mcg by mouth daily.    [provider]  HYDROcodone-acetaminophen (NORCO/VICODIN) 5-325 MG per tablet Take 2 tablets by mouth every 4 (four) hours as needed. 08/29/14   Rolland Porter, MD  ibuprofen (ADVIL,MOTRIN) 200 MG tablet Take 400 mg by mouth every 6 (six) hours as needed for moderate pain.    [provider]  Multiple Vitamin (MULTIVITAMIN WITH MINERALS) TABS tablet Take 1 tablet by mouth daily.    [provider]  Omega-3 Fatty Acids (FISH OIL PO) Take 3 capsules by mouth daily.    [provider]  predniSONE (DELTASONE) 10 MG tablet Take 4 tablets (40 mg total) by mouth daily. Patient not taking: Reported on 08/29/2014 10/04/11   Trixie Dredge, PA-C       Allergies    Penicillins    Review of Systems   Review of Systems  Constitutional:  Negative for fever.  Respiratory:  Positive for cough and shortness of breath. Negative for wheezing.   Cardiovascular:  Negative for chest pain and leg swelling.  Gastrointestinal:  Negative for abdominal pain, nausea and vomiting.  Genitourinary:  Negative for dysuria.    Physical Exam Updated Vital Signs BP (!) 150/123   Pulse (!) 101   Temp 97.6 F (36.4 C) (Oral)   Resp (!) 25   Ht 5\' 7"  (1.702 m)   Wt 90.7 kg   LMP 04/29/2007   SpO2 97%   BMI 31.32 kg/m  Physical Exam Vitals and nursing note reviewed.  Constitutional:      General: She is not in acute distress. HENT:     Head: Normocephalic and atraumatic.  Eyes:     Extraocular Movements: Extraocular movements intact.  Cardiovascular:     Rate and Rhythm: Tachycardia present. Rhythm irregular.     Heart sounds: Normal heart sounds.  Pulmonary:     Effort: Tachypnea present.     Breath sounds: Rales present.  Chest:     Chest wall: No tenderness.  Abdominal:     Palpations: Abdomen is soft.     Tenderness: There is no abdominal tenderness.  Musculoskeletal:        General: Normal range of motion.  Cervical back: Normal range of motion and neck supple.  Skin:    General: Skin is warm and dry.     Capillary Refill: Capillary refill takes less than 2 seconds.  Neurological:     Mental Status: She is alert.     ED Results / Procedures / Treatments   Labs (all labs ordered are listed, but only abnormal results are displayed) Labs Reviewed  BRAIN NATRIURETIC PEPTIDE - Abnormal; Notable for the following components:      Result Value   B Natriuretic Peptide 237.2 (*)    All other components within normal limits  BASIC METABOLIC PANEL - Abnormal; Notable for the following components:   Glucose, Bld 107 (*)    Calcium 8.4 (*)    All other components within normal limits  CBC WITH DIFFERENTIAL/PLATELET  TROPONIN I  (HIGH SENSITIVITY)    EKG EKG Interpretation  Date/Time:  Thursday November 07 2021 09:18:20 EDT Ventricular Rate:  129 PR Interval:    QRS Duration: 77 QT Interval:  319 QTC Calculation: 468 R Axis:   15 Text Interpretation: Atrial fibrillation Low voltage, extremity and precordial leads Confirmed by Lorre Nick ([id number]) on 11/07/2021 10:11:27 AM  Radiology DG Chest 2 View  Result Date: 11/07/2021 CLINICAL DATA:  Shortness of breath EXAM: CHEST - 2 VIEW COMPARISON:  None Available. FINDINGS: Cardiac contours are upper limits of normal in size. Bilateral interstitial opacities. No focal consolidation. No large pleural effusion or pneumothorax. IMPRESSION: Bilateral interstitial opacities, likely due to pulmonary edema. Electronically Signed   By: Allegra Lai M.D.   On: 11/07/2021 09:44    Procedures .Critical Care  Performed by: Darrick Grinder, PA-C Authorized by: Darrick Grinder, PA-C   Critical care provider statement:    Critical care time (minutes):  30   Critical care time was exclusive of:  Separately billable procedures and treating other patients   Critical care was necessary to treat or prevent imminent or life-threatening deterioration of the following conditions:  Respiratory failure   Critical care was time spent personally by me on the following activities:  Development of treatment plan with patient or surrogate, discussions with consultants, evaluation of patient's response to treatment, examination of patient, ordering and review of laboratory studies, ordering and review of radiographic studies, ordering and performing treatments and interventions, pulse oximetry, re-evaluation of patient's condition and review of old charts   Care discussed with: admitting provider       Medications Ordered in ED Medications  levalbuterol (XOPENEX) nebulizer solution 1.25 mg (has no administration in time range)  diltiazem (CARDIZEM) injection 10 mg (10 mg Intravenous  Given 11/07/21 1050)  furosemide (LASIX) injection 40 mg (40 mg Intravenous Given 11/07/21 1302)    ED Course/ Medical Decision Making/ A&P                           Medical Decision Making Amount and/or Complexity of Data Reviewed Labs: ordered. Radiology: ordered. ECG/medicine tests: ordered.  Risk Prescription drug management.   Patient presents with a chief complaint of shortness of breath.  Differential includes CHF, pneumonia, atrial fibrillation, respiratory illness, and others  I reviewed patient's medical history and found no recent relevant medical records  I ordered and reviewed labs.  Pertinent results include BNP 237.2, grossly normal CBC and BMP.  Troponin pending  I ordered an EKG which showed an underlying rhythm of atrial fibrillation  I ordered imaging including a chest x-ray.  I  interpreted this image myself.  Results include bilateral interstitial opacities likely due to pulmonary edema.  I agree with the radiologist findings.  I ordered the patient Cardizem for rate control, Lasix for fluid overload, BiPap for pulmonary edema.  The patient was feeling much better after BiPap and medications  I requested consultation with cardiology who agreed to put patient on the rounding list for [facility]  I requested consultation with the hospitalist for possible admission. Dr.Ortiz agreed to see the patient and requested a bed for admission  The patient appears to have previously undiagnosed atrial fibrillation and also appears to have fluid overload.  She is hypertensive and has not been treated as an outpatient for this.  No sign of pneumonia on chest x-ray.  At this time this does not seem infectious.  The patient will benefit from admission for further evaluation and management        Final Clinical Impression(s) / ED Diagnoses Final diagnoses:  Elevated brain natriuretic peptide (BNP) level  Dyspnea on exertion  Atrial fibrillation, unspecified  type [facility])    Rx / DC Orders ED Discharge Orders     None         Ronny Bacon 11/07/21 1324    Lacretia Leigh, MD 11/07/21 1332

## 2021-11-07 NOTE — Progress Notes (Signed)
   11/07/21 1840  Assess: MEWS Score  Temp 98.1 F (36.7 C)  BP (!) 145/95  MAP (mmHg) 107  Pulse Rate (!) 104  Resp (!) 25  Level of Consciousness Alert  SpO2 96 %  O2 Device Nasal Cannula  O2 Flow Rate (L/min) 3 L/min  Assess: MEWS Score  MEWS Temp 0  MEWS Systolic 0  MEWS Pulse 1  MEWS RR 1  MEWS LOC 0  MEWS Score 2  MEWS Score Color Yellow  Assess: if the MEWS score is Yellow or Red  Were vital signs taken at a resting state? Yes  Focused Assessment No change from prior assessment  Does the patient meet 2 or more of the SIRS criteria? Yes  Does the patient have a confirmed or suspected source of infection? No  MEWS guidelines implemented *See Row Information* Yes  Treat  Pain Scale 0-10  Pain Score 0  Patients Stated Pain Goal 0  Take Vital Signs  Increase Vital Sign Frequency  Yellow: Q 2hr X 2 then Q 4hr X 2, if remains yellow, continue Q 4hrs  Escalate  MEWS: Escalate Yellow: discuss with charge nurse/RN and consider discussing with provider and RRT  Notify: Charge Nurse/RN  Name of Charge Nurse/RN Notified Marissa L RN  Date Charge Nurse/RN Notified 11/07/21  Time Charge Nurse/RN Notified 1846  Assess: SIRS CRITERIA  SIRS Temperature  0  SIRS Pulse 1  SIRS Respirations  1  SIRS WBC 0  SIRS Score Sum  2

## 2021-11-07 NOTE — ED Notes (Signed)
RT called to place pt on BiPAP

## 2021-11-07 NOTE — ED Triage Notes (Signed)
Patient reports shortness of breath, cough and nausea x 4 days. States there is house work being done that could contribute to her symptoms. Reports yellow phlegm with cough. C/o rib cage pain from coughing.

## 2021-11-07 NOTE — Consult Note (Signed)
CONSULTATION NOTE   Patient Name: ARRIE BORRELLI Date of Encounter: 11/07/2021 Cardiologist: None Electrophysiologist: None Advanced Heart Failure: None   Chief Complaint   Shortness of breath  Patient Profile   66 yo female with history of hypertension, presents with shortness of breath, found to be in new afib with CHF  HPI   MAHUM BETTEN is a 66 y.o. female who is being seen today for the evaluation of afib/CHF at the request of Dr. Robb Matar. This is a 66 year old female with a history of hypertension and.  She is also has a 15-pack-year tobacco use history.  Presents with 3 to 4 days of cough and worsening shortness of breath and rib pain.  On arrival she was noted to be in atrial fibrillation with rapid ventricular response.  Chest X-ray shows bilateral interstitial Apadaz disease likely pulmonary edema.  BNP is mildly elevated at 237.  Initial troponin is negative at 3, subsequent troponin is pending.  Labs otherwise unremarkable.  She was placed on BiPAP for her shortness of breath..  She takes no regular home medications.  She was given IV diltiazem and Lasix and cardiology was consulted for recommendations regarding management of A-fib and heart failure.   PMHx   Past Medical History:  Diagnosis Date   Hypertension     Past Surgical History:  Procedure Laterality Date   BREAST REDUCTION SURGERY     OTHER SURGICAL HISTORY     LEAP procedure    FAMHx   Family History  Problem Relation Age of Onset   Diabetes Mother    Hypertension Mother     SOCHx    reports that she has been smoking cigarettes. She has been smoking an average of 1 pack per day. She does not have any smokeless tobacco history on file. She reports current alcohol use. No history on file for drug use.  Outpatient Medications   No current facility-administered medications on file prior to encounter.   Current Outpatient Medications on File Prior to Encounter  Medication Sig Dispense  Refill   BIOTIN PO Take 1 tablet by mouth daily.     Capsicum, Cayenne, (CAYENNE PEPPER PO) Take 2 capsules by mouth daily.     folic acid (FOLVITE) 800 MCG tablet Take 6 mcg by mouth daily.     HYDROcodone-acetaminophen (NORCO/VICODIN) 5-325 MG per tablet Take 2 tablets by mouth every 4 (four) hours as needed. 10 tablet 0   ibuprofen (ADVIL,MOTRIN) 200 MG tablet Take 400 mg by mouth every 6 (six) hours as needed for moderate pain.     Multiple Vitamin (MULTIVITAMIN WITH MINERALS) TABS tablet Take 1 tablet by mouth daily.     Omega-3 Fatty Acids (FISH OIL PO) Take 3 capsules by mouth daily.     predniSONE (DELTASONE) 10 MG tablet Take 4 tablets (40 mg total) by mouth daily. (Patient not taking: Reported on 08/29/2014) 56 tablet 0    Inpatient Medications    Scheduled Meds:  levalbuterol  1.25 mg Nebulization Q6H    Continuous Infusions:   PRN Meds:    ALLERGIES   Allergies  Allergen Reactions   Penicillins Anaphylaxis    ROS   Pertinent items noted in HPI and remainder of comprehensive ROS otherwise negative.  Vitals   Vitals:   11/07/21 1209 11/07/21 1215 11/07/21 1245 11/07/21 1300  BP:  (!) 152/112 (!) 146/122 (!) 150/123  Pulse: (!) 105 (!) 52 97 (!) 101  Resp: 19 (!) 26 (!) 29 (!) 25  Temp:      TempSrc:      SpO2: 98% 94% 95% 97%  Weight:      Height:       No intake or output data in the 24 hours ending 11/07/21 1426 Filed Weights   11/07/21 0916  Weight: 90.7 kg    Physical Exam   General appearance: alert, mild distress, and on BiPAP Neck: JVD - a few cm above sternal notch, no carotid bruit, and thyroid not enlarged, symmetric, no tenderness/mass/nodules Lungs: diminished breath sounds bilaterally and wheezes bilaterally Heart: irregularly irregular rhythm Abdomen: soft, non-tender; bowel sounds normal; no masses,  no organomegaly and obese Extremities: extremities normal, atraumatic, no cyanosis or edema Pulses: 2+ and symmetric Skin: Pale, warm,  dry Neurologic: Grossly normal Psych: Pleasant  Labs   Results for orders placed or performed during the hospital encounter of 11/07/21 (from the past 48 hour(s))  CBC with Differential/Platelet     Status: None   Collection Time: 11/07/21 10:25 AM  Result Value Ref Range   WBC 7.2 4.0 - 10.5 K/uL   RBC 4.87 3.87 - 5.11 MIL/uL   Hemoglobin 14.5 12.0 - 15.0 g/dL   HCT 43.9 36.0 - 46.0 %   MCV 90.1 80.0 - 100.0 fL   MCH 29.8 26.0 - 34.0 pg   MCHC 33.0 30.0 - 36.0 g/dL   RDW 15.4 11.5 - 15.5 %   Platelets 219 150 - 400 K/uL   nRBC 0.0 0.0 - 0.2 %   Neutrophils Relative % 64 %   Neutro Abs 4.6 1.7 - 7.7 K/uL   Lymphocytes Relative 25 %   Lymphs Abs 1.8 0.7 - 4.0 K/uL   Monocytes Relative 7 %   Monocytes Absolute 0.5 0.1 - 1.0 K/uL   Eosinophils Relative 3 %   Eosinophils Absolute 0.2 0.0 - 0.5 K/uL   Basophils Relative 1 %   Basophils Absolute 0.1 0.0 - 0.1 K/uL   Immature Granulocytes 0 %   Abs Immature Granulocytes 0.02 0.00 - 0.07 K/uL    Comment: Performed at [facility], [place] [address], [place], [place] [address]  Brain natriuretic peptide     Status: Abnormal   Collection Time: 11/07/21 10:25 AM  Result Value Ref Range   B Natriuretic Peptide 237.2 (H) 0.0 - 100.0 pg/mL    Comment: Performed at [facility], [place] [address], [place], [place] [id number]  Basic metabolic panel     Status: Abnormal   Collection Time: 11/07/21 10:50 AM  Result Value Ref Range   Sodium 138 135 - 145 mmol/L   Potassium 4.1 3.5 - 5.1 mmol/L   Chloride 108 98 - 111 mmol/L   CO2 23 22 - 32 mmol/L   Glucose, Bld 107 (H) 70 - 99 mg/dL    Comment: Glucose reference range applies only to samples taken after fasting for at least 8 hours.   BUN 15 8 - 23 mg/dL   Creatinine, Ser 0.81 0.44 - 1.00 mg/dL   Calcium 8.4 (L) 8.9 - 10.3 mg/dL   GFR, Estimated >60 >60 mL/min    Comment: (NOTE) Calculated using the CKD-EPI Creatinine Equation (2021)    Anion gap 7 5  - 15    Comment: Performed at [facility], [place] [address] [place], [place] [address]  Troponin I (High Sensitivity)     Status: None   Collection Time: 11/07/21 10:50 AM  Result Value Ref Range   Troponin I (High Sensitivity)  3 <18 ng/L    Comment: (NOTE) Elevated high sensitivity troponin I (hsTnI) values and significant  changes across serial measurements may suggest ACS but many other  chronic and acute conditions are known to elevate hsTnI results.  Refer to the "Links" section for chest pain algorithms and additional  guidance. Performed at [facility], [address] [place], [place] [address]     ECG   Atrial fibrillation at 118, poor R wave progression anteriorly- Personally Reviewed  Telemetry   A-fib with RVR- Personally Reviewed  Radiology   DG Chest 2 View  Result Date: 11/07/2021 CLINICAL DATA:  Shortness of breath EXAM: CHEST - 2 VIEW COMPARISON:  None Available. FINDINGS: Cardiac contours are upper limits of normal in size. Bilateral interstitial opacities. No focal consolidation. No large pleural effusion or pneumothorax. IMPRESSION: Bilateral interstitial opacities, likely due to pulmonary edema. Electronically Signed   By: Allegra Lai M.D.   On: 11/07/2021 09:44    Cardiac Studies   N/A  Impression   Principal Problem:   Volume overload   Recommendation   New onset atrial fibrillation She is noted to have new onset atrial fibrillation, the duration of which is unclear but may be up to 4 days.  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  She has slowed somewhat with diltiazem.  Because of her heart failure I prefer to manage her with a beta-blocker and will start metoprolol tartrate 25 mg twice daily.  She will need to be anticoagulated.  Her calculated CHA2DS2-VASc score as a female over age 24 with hypertension is 4, therefore would recommend  starting Eliquis 5 mg twice daily. Congestive heart failure She presented with persistent cough and shortness of breath with orthopnea, but denied any lower extremity edema.  BNP is elevated and x-ray shows bilateral interstitial edema.  Agree with diuresis and would continue Lasix 40 mg IV twice daily.  We will obtain an echocardiogram to further evaluate LV function.  Plan to start beta-blocker for A-fib rate control and for presumed cardiomyopathy. Hypertension History of hypertension but not on medications.  Blood pressure here has been significant elevated with diastolic blood pressures over 712 and systolic blood pressures in the 140-160 range.  Plan to start metoprolol but will likely need additional agents based on echo findings. Tobacco abuse She reports she recently cut back on her smoking but does have a 15-pack-year history.  She now says that she will quit smoking completely.  Thanks for the consultation.  Cardiology will follow with you  Time Spent Directly with Patient:  I have spent a total of 45 minutes with the patient reviewing hospital notes, telemetry, EKGs, labs and examining the patient as well as establishing an assessment and plan that was discussed personally with the patient.  > 50% of time was spent in direct patient care.  Length of Stay:  LOS: 0 days   Chrystie Nose, MD, [facility], FACP  [facility]  Medical Director of the Advanced Lipid Disorders &  Cardiovascular Risk Reduction Clinic Diplomate of the American Board of Clinical Lipidology Attending Cardiologist  Direct Dial: [phone]  Fax: [phone]  Website:  [email or web address]   Lisette Abu Oliva Montecalvo 11/07/2021, 2:26 PM

## 2021-11-07 NOTE — ED Notes (Signed)
RT notified of need for neb tx.

## 2021-11-08 ENCOUNTER — Encounter (HOSPITAL_COMMUNITY): Payer: Self-pay | Admitting: Internal Medicine

## 2021-11-08 ENCOUNTER — Observation Stay (HOSPITAL_COMMUNITY): Payer: Medicare Other

## 2021-11-08 DIAGNOSIS — Z880 Allergy status to penicillin: Secondary | ICD-10-CM | POA: Diagnosis not present

## 2021-11-08 DIAGNOSIS — I081 Rheumatic disorders of both mitral and tricuspid valves: Secondary | ICD-10-CM | POA: Diagnosis not present

## 2021-11-08 DIAGNOSIS — E038 Other specified hypothyroidism: Secondary | ICD-10-CM | POA: Diagnosis present

## 2021-11-08 DIAGNOSIS — I509 Heart failure, unspecified: Secondary | ICD-10-CM | POA: Diagnosis not present

## 2021-11-08 DIAGNOSIS — Z8249 Family history of ischemic heart disease and other diseases of the circulatory system: Secondary | ICD-10-CM | POA: Diagnosis not present

## 2021-11-08 DIAGNOSIS — Z79899 Other long term (current) drug therapy: Secondary | ICD-10-CM | POA: Diagnosis not present

## 2021-11-08 DIAGNOSIS — I5041 Acute combined systolic (congestive) and diastolic (congestive) heart failure: Secondary | ICD-10-CM | POA: Diagnosis not present

## 2021-11-08 DIAGNOSIS — Z6831 Body mass index (BMI) 31.0-31.9, adult: Secondary | ICD-10-CM | POA: Diagnosis not present

## 2021-11-08 DIAGNOSIS — J9601 Acute respiratory failure with hypoxia: Secondary | ICD-10-CM

## 2021-11-08 DIAGNOSIS — I4819 Other persistent atrial fibrillation: Secondary | ICD-10-CM | POA: Diagnosis present

## 2021-11-08 DIAGNOSIS — I4891 Unspecified atrial fibrillation: Secondary | ICD-10-CM | POA: Diagnosis not present

## 2021-11-08 DIAGNOSIS — I340 Nonrheumatic mitral (valve) insufficiency: Secondary | ICD-10-CM | POA: Diagnosis not present

## 2021-11-08 DIAGNOSIS — I5023 Acute on chronic systolic (congestive) heart failure: Secondary | ICD-10-CM | POA: Diagnosis not present

## 2021-11-08 DIAGNOSIS — R0609 Other forms of dyspnea: Secondary | ICD-10-CM | POA: Diagnosis present

## 2021-11-08 DIAGNOSIS — I5043 Acute on chronic combined systolic (congestive) and diastolic (congestive) heart failure: Secondary | ICD-10-CM | POA: Diagnosis present

## 2021-11-08 DIAGNOSIS — Z833 Family history of diabetes mellitus: Secondary | ICD-10-CM | POA: Diagnosis not present

## 2021-11-08 DIAGNOSIS — E669 Obesity, unspecified: Secondary | ICD-10-CM | POA: Diagnosis present

## 2021-11-08 DIAGNOSIS — I110 Hypertensive heart disease with heart failure: Secondary | ICD-10-CM | POA: Diagnosis present

## 2021-11-08 DIAGNOSIS — I429 Cardiomyopathy, unspecified: Secondary | ICD-10-CM | POA: Diagnosis present

## 2021-11-08 DIAGNOSIS — I361 Nonrheumatic tricuspid (valve) insufficiency: Secondary | ICD-10-CM | POA: Diagnosis not present

## 2021-11-08 DIAGNOSIS — J209 Acute bronchitis, unspecified: Secondary | ICD-10-CM | POA: Diagnosis present

## 2021-11-08 DIAGNOSIS — F17210 Nicotine dependence, cigarettes, uncomplicated: Secondary | ICD-10-CM | POA: Diagnosis present

## 2021-11-08 DIAGNOSIS — Z720 Tobacco use: Secondary | ICD-10-CM | POA: Diagnosis not present

## 2021-11-08 DIAGNOSIS — I517 Cardiomegaly: Secondary | ICD-10-CM | POA: Diagnosis not present

## 2021-11-08 DIAGNOSIS — E876 Hypokalemia: Secondary | ICD-10-CM | POA: Diagnosis not present

## 2021-11-08 LAB — COMPREHENSIVE METABOLIC PANEL
ALT: 88 U/L — ABNORMAL HIGH (ref 0–44)
AST: 59 U/L — ABNORMAL HIGH (ref 15–41)
Albumin: 3.7 g/dL (ref 3.5–5.0)
Alkaline Phosphatase: 63 U/L (ref 38–126)
Anion gap: 11 (ref 5–15)
BUN: 22 mg/dL (ref 8–23)
CO2: 28 mmol/L (ref 22–32)
Calcium: 8.6 mg/dL — ABNORMAL LOW (ref 8.9–10.3)
Chloride: 102 mmol/L (ref 98–111)
Creatinine, Ser: 0.94 mg/dL (ref 0.44–1.00)
GFR, Estimated: >60 mL/min (ref >60)
Glucose, Bld: 191 mg/dL — ABNORMAL HIGH (ref 70–99)
Potassium: 4.2 mmol/L (ref 3.5–5.1)
Sodium: 141 mmol/L (ref 135–145)
Total Bilirubin: 0.9 mg/dL (ref 0.3–1.2)
Total Protein: 7.2 g/dL (ref 6.5–8.1)

## 2021-11-08 LAB — ECHOCARDIOGRAM COMPLETE
Calc EF: 35.5 %
Height: 67 in
MV M vel: 4.1 m/s
MV Peak grad: 67.2 mmHg
Radius: 0.4 cm
S' Lateral: 5.1 cm
Single Plane A2C EF: 34.4 %
Single Plane A4C EF: 38 %
Weight: 3241.64 oz

## 2021-11-08 LAB — HIV ANTIBODY (ROUTINE TESTING W REFLEX): HIV Screen 4th Generation wRfx: NONREACTIVE

## 2021-11-08 MED ORDER — IPRATROPIUM BROMIDE 0.02 % IN SOLN: 0.5000 mg | Freq: Four times a day (QID) | RESPIRATORY_TRACT | Status: DC | PRN

## 2021-11-08 MED ORDER — FUROSEMIDE 10 MG/ML IJ SOLN
60.0000 mg | Freq: Two times a day (BID) | INTRAMUSCULAR | Status: DC
Start: 2021-11-08 — End: 2021-11-09
  Administered 2021-11-08 (×2): 60 mg via INTRAVENOUS
  Filled 2021-11-08: qty 6

## 2021-11-08 MED ORDER — FUROSEMIDE 10 MG/ML IJ SOLN
60.0000 mg | Freq: Two times a day (BID) | INTRAMUSCULAR | Status: DC
Start: 2021-11-08 — End: 2021-11-08
  Filled 2021-11-08: qty 6

## 2021-11-08 MED ORDER — LEVALBUTEROL HCL 1.25 MG/0.5ML IN NEBU: 1.2500 mg | INHALATION_SOLUTION | Freq: Four times a day (QID) | RESPIRATORY_TRACT | Status: DC | PRN

## 2021-11-08 NOTE — Assessment & Plan Note (Signed)
Smoking cessation recommended 

## 2021-11-08 NOTE — Assessment & Plan Note (Addendum)
New onset paroxysmal atrial fibrillation.  CHA2DS2-Vasc 4.  Tele rviewed, still in Afib. - Continue new Eliquis - Continue metoprolol - Keep potassium greater than 4, magnesium greater than 2 - Plan for DCCV next week

## 2021-11-08 NOTE — Assessment & Plan Note (Signed)
In the ER, patient hypoxic into the 80s, was tachypneic to the high 20s.  Required BiPAP.  Overnight has improved to 2L.

## 2021-11-08 NOTE — Discharge Instructions (Signed)

## 2021-11-08 NOTE — TOC Initial Note (Signed)
Transition of Care [facility]) - Initial/Assessment Note    Patient Details  Name: Brandi Stanton MRN: [id number] Date of Birth: 05/11/1955  Transition of Care [facility]) CM/SW Contact:    Lanier Clam, RN Phone Number: 11/08/2021, 12:01 PM  Clinical Narrative:  Monitor on 02.                 Expected Discharge Plan: Home/Self Care Barriers to Discharge: Continued Medical Work up   Patient Goals and CMS Choice        Expected Discharge Plan and Services Expected Discharge Plan: Home/Self Care                                              Prior Living Arrangements/Services                       Activities of Daily Living Home Assistive Devices/Equipment: Eyeglasses ADL Screening (condition at time of admission) Patient's cognitive ability adequate to safely complete daily activities?: Yes Is the patient deaf or have difficulty hearing?: No Does the patient have difficulty seeing, even when wearing glasses/contacts?: No Does the patient have difficulty concentrating, remembering, or making decisions?: No Patient able to express need for assistance with ADLs?: Yes Does the patient have difficulty dressing or bathing?: No Independently performs ADLs?: Yes (appropriate for developmental age) Does the patient have difficulty walking or climbing stairs?: Yes (sob) Weakness of Legs: Both Weakness of Arms/Hands: Both  Permission Sought/Granted                  Emotional Assessment              Admission diagnosis:  Dyspnea on exertion [R06.09] Volume overload [E87.70] Elevated brain natriuretic peptide (BNP) level [R79.89] Atrial fibrillation, unspecified type (HCC) [I48.91] Patient Active Problem List   Diagnosis Date Noted   Acute CHF (congestive heart failure) (HCC) 11/07/2021   Class 1 obesity 11/07/2021   Hypocalcemia 11/07/2021   Atrial fibrillation with RVR (HCC) 11/07/2021   Acute respiratory failure with hypoxia (HCC) 11/07/2021    Acute bronchitis 11/07/2021   Tobacco use 11/07/2021   Acute congestive heart failure (HCC)    Atrial fibrillation (HCC)    Uncontrolled hypertension    Closed fracture of left tibial plateau 08/31/2014   PCP:  Pcp, No Pharmacy:   [facility] - [place], [place] - [address] [place] [address] Phone: [phone] Fax: [phone]  [facility] [address] [place] - [address] [place] [address] Phone: [phone] Fax: [phone]  [facility] - [place], [place] - [address] [place] [address] Phone: [phone] Fax: [phone]     Social Determinants of Health (SDOH) Interventions    Readmission Risk Interventions     No data to display

## 2021-11-08 NOTE — Assessment & Plan Note (Signed)
Supplemented 

## 2021-11-08 NOTE — Progress Notes (Signed)
SATURATION QUALIFICATIONS: (This note is used to comply with regulatory documentation for home oxygen)  Patient Saturations on Room Air at Rest = 88%  Patient Saturations on Room Air while Ambulating = 83%  Patient Saturations on 6 Liters of oxygen while Ambulating = 92%  Please briefly explain why patient needs home oxygen: Pt needs O2 for rest and exertion

## 2021-11-08 NOTE — Assessment & Plan Note (Signed)
Blood pressure controlled - Continue metoprolol and lisinopril

## 2021-11-08 NOTE — Progress Notes (Signed)
  Echocardiogram 2D Echocardiogram has been performed.  [name] 11/08/2021, 12:12 PM

## 2021-11-08 NOTE — Progress Notes (Signed)
Progress Note  Patient Name: Brandi Stanton Date of Encounter: 11/08/2021  [facility] Cardiologist: Chrystie Nose, MD   Subjective   Denies any CP, breathing still not back to baseline but close  Inpatient Medications    Scheduled Meds:  apixaban  5 mg Oral BID   furosemide  60 mg Intravenous BID   ipratropium  0.5 mg Nebulization TID   levalbuterol  1.25 mg Nebulization TID   lisinopril  2.5 mg Oral Daily   metoprolol tartrate  25 mg Oral BID   Continuous Infusions:  PRN Meds: acetaminophen **OR** acetaminophen, nicotine, ondansetron **OR** ondansetron (ZOFRAN) IV   Vital Signs    Vitals:   11/08/21 0205 11/08/21 0400 11/08/21 0500 11/08/21 0525  BP: 119/72 101/81  120/83  Pulse: 64 79  73  Resp: 18   18  Temp: 98.6 F (37 C)   98.1 F (36.7 C)  TempSrc: Oral     SpO2: 94% 95%  96%  Weight:   91.9 kg   Height:        Intake/Output Summary (Last 24 hours) at 11/08/2021 0853 Last data filed at 11/07/2021 1439 Gross per 24 hour  Intake --  Output 1000 ml  Net -1000 ml      11/08/2021    5:00 AM 11/07/2021    9:16 AM  Last 3 Weights  Weight (lbs) 202 lb 9.6 oz 200 lb  Weight (kg) 91.9 kg 90.719 kg      Telemetry    Atrial fibrillation with HR 80s overnight, HR increased to 90s this morning.  - Personally Reviewed  ECG    Atrial fibrillation with RVR - Personally Reviewed  Physical Exam   GEN: No acute distress.   Neck: No JVD Cardiac: irregularly irregular, no murmurs, rubs, or gallops.  Respiratory: Clear to auscultation bilaterally. GI: Soft, nontender, non-distended  MS: No edema; No deformity. Neuro:  Nonfocal  Psych: Normal affect   Labs    High Sensitivity Troponin:   Recent Labs  Lab 11/07/21 1050 11/07/21 1448  TROPONINIHS 3 3     Chemistry Recent Labs  Lab 11/07/21 1050 11/08/21 0510  NA 138 141  K 4.1 4.2  CL 108 102  CO2 23 28  GLUCOSE 107* 191*  BUN 15 22  CREATININE 0.81 0.94  CALCIUM 8.4* 8.6*  PROT   --  7.2  ALBUMIN  --  3.7  AST  --  59*  ALT  --  88*  ALKPHOS  --  63  BILITOT  --  0.9  GFRNONAA >60 >60  ANIONGAP 7 11    Lipids No results for input(s): "CHOL", "TRIG", "HDL", "LABVLDL", "LDLCALC", "CHOLHDL" in the last 168 hours.  Hematology Recent Labs  Lab 11/07/21 1025  WBC 7.2  RBC 4.87  HGB 14.5  HCT 43.9  MCV 90.1  MCH 29.8  MCHC 33.0  RDW 15.4  PLT 219   Thyroid No results for input(s): "TSH", "FREET4" in the last 168 hours.  BNP Recent Labs  Lab 11/07/21 1025  BNP 237.2*    DDimer No results for input(s): "DDIMER" in the last 168 hours.   Radiology    DG Chest 2 View  Result Date: 11/07/2021 CLINICAL DATA:  Shortness of breath EXAM: CHEST - 2 VIEW COMPARISON:  None Available. FINDINGS: Cardiac contours are upper limits of normal in size. Bilateral interstitial opacities. No focal consolidation. No large pleural effusion or pneumothorax. IMPRESSION: Bilateral interstitial opacities, likely due to pulmonary edema. Electronically  Signed   By: Allegra Lai M.D.   On: 11/07/2021 09:44    Cardiac Studies   Pending echo  Patient Profile     66 y.o. female with PMH of HTN presented with dyspnea and found to be in new afib with CHF  Assessment & Plan    Newly diagnosed atrial fibrillation with RVR  - HR improved on metoprolol tartrate 25mg  BID. Started on Eliquis  - no obvious cardiac awareness of afib. If rate controlled, assume no significant change on echo, may consider outpatient DCCV in 3 weeks. However if EF is significantly lower on echo, may need TEE DCCV  - she usually drink a pot of coffee per day, need to cut back. Obtain TSH tomorrow AM  - CHA2DS2-Vasc score 4 (CHF, female, age >45, HTN)  Acute CHF: likely related to new afib  - pending echocardiogram  - felt to be volume overloaded on arrival, started on IV lasix, IV lasix increased to 60mg  BID this morning. Weight is going up, likely not accurate. I/O not accurate either.   - on exam,  close to euvolemic level, although patient says her breathing is significantly improved but still not back at baseline. Will continue IV lasix today, likely will be euvolemic tomorrow.   HTN  Tobacco abuse      For questions or updates, please contact [facility] Please consult [email or web address] for contact info under        Signed, >73, PA  11/08/2021, 8:53 AM

## 2021-11-08 NOTE — Progress Notes (Signed)
  Progress Note   Patient: Brandi Stanton TMH:[id number] DOB: 12/09/1955 DOA: 11/07/2021     0 DOS: the patient was seen and examined on 11/08/2021 at 4:30 PM      Brief hospital course: Brandi Stanton is a 66 y.o. F with HTN, smoking, obesity who presented with progrsesive dyspnea, cough, and orthopnea.  In the ER, CXR showed cardiomegaly and pulmonary edema.  Started on diuretics and admitted.     Assessment and Plan: * Acute on chronic systolic CHF (congestive heart failure) (HCC) Prsented with dyspnea on exertion, orthopnea, cough, CXR with infiltrates and cardiomegaly.  Echo 7/14 showed EF 30-35%, new finding.  Net negativ 1L only yeterday, feels symptoms are somewhat better.  Cr and K stable - Continue furosemide, increase dose - Continue potassium - Strict I/Os, daily weights, daily BMP - Consult Cardiology, apprecaite cares - Continue metoprolol, lisinopril now, defer transition to GDMT to Cardiology  Atrial fibrillation with RVR (HCC) New onset paroxysmal atrial fibrillation.  CHA2DS2-Vasc 4. - Continue Eliquis - Continue metoprolol  Tobacco use Smoking cessation recommended  Acute respiratory failure with hypoxia (HCC) In the ER, patient hypoxic into the 80s, was tachypneic to the high 20s.  Required BiPAP.  Overnight has improved to 2L.  Hypocalcemia - Supplement Calcium  Class 1 obesity BMI 31  Uncontrolled hypertension BP slightly high here - Continue furosemide, metoprolol and lisinopril, plan to adjust for HF mgmt          Subjective: Breathing feels better, she still has mild swelling, she still mild orthopnea, she has no dyspnea with exertion today she claims.  She still has a cough.  No fever, sputum     Physical Exam: Vitals:   11/08/21 0930 11/08/21 0935 11/08/21 0938 11/08/21 1254  BP: 121/85   (!) 133/91  Pulse: 97   94  Resp:    18  Temp:    98.2 F (36.8 C)  TempSrc:    Oral  SpO2: (!) 89% (!) 88% 93% 93%  Weight:       Height:       Adult female, lying in bed, no acute distress Rate controlled but irregular rhythm, soft systolic murmur, JVP not visible due to body habitus Mild nonpitting lower extremity edema Respiratory rate normal, rales at the left base, no wheezing, no increased respiratory effort Abdomen soft without tenderness palpation or guarding, no ascites or distention Attention normal, affect normal, judgment and insight appear normal  Data Reviewed: Chest x-ray personally reviewed showed bilateral opacities Complete blood count normal Basic metabolic panel shows normal sodium potassium and creatinine. LFTs slightly elevated, total bilirubin normal Troponin normal BNP elevated      Disposition: Status is: Inpatient         Author: Alberteen Sam, MD 11/08/2021 5:24 PM  For on call review [email or web address].

## 2021-11-08 NOTE — Plan of Care (Signed)

## 2021-11-08 NOTE — Progress Notes (Signed)
Attempted wean to RA. Pt O2 sats 88-90% at rest. Placed pt on 2L and pt maintained O2 sats 92-94%

## 2021-11-08 NOTE — Hospital Course (Signed)
Brandi Stanton is a 66 y.o. F with HTN, smoking, obesity who presented with progrsesive dyspnea, cough, and orthopnea.  In the ER, CXR showed cardiomegaly and pulmonary edema.  Started on diuretics and admitted.

## 2021-11-08 NOTE — Assessment & Plan Note (Addendum)
Prsented with dyspnea on exertion, orthopnea, cough, CXR with infiltrates and cardiomegaly.  Echo 7/14 showed EF 30-35%, new finding.  Net negative 500cc again, recorded weights incongruent.  Symptoms stably improved. Still in Afib, but rates better.  Cr improved, K normal - Hold furosemide and K - Strict I/Os, daily weights, daily BMP - Consult Cardiology, appreciate cares - Continue metoprolol, lisinopril now, defer transition to GDMT to Cardiology - Plan for TEE DCCV early next week

## 2021-11-08 NOTE — Assessment & Plan Note (Signed)
BMI 31 °

## 2021-11-09 ENCOUNTER — Inpatient Hospital Stay (HOSPITAL_COMMUNITY): Payer: Medicare Other

## 2021-11-09 DIAGNOSIS — I5041 Acute combined systolic (congestive) and diastolic (congestive) heart failure: Secondary | ICD-10-CM | POA: Diagnosis not present

## 2021-11-09 DIAGNOSIS — Z720 Tobacco use: Secondary | ICD-10-CM | POA: Diagnosis not present

## 2021-11-09 DIAGNOSIS — E669 Obesity, unspecified: Secondary | ICD-10-CM | POA: Diagnosis not present

## 2021-11-09 DIAGNOSIS — E038 Other specified hypothyroidism: Secondary | ICD-10-CM | POA: Insufficient documentation

## 2021-11-09 DIAGNOSIS — I5023 Acute on chronic systolic (congestive) heart failure: Secondary | ICD-10-CM | POA: Diagnosis not present

## 2021-11-09 DIAGNOSIS — E039 Hypothyroidism, unspecified: Secondary | ICD-10-CM

## 2021-11-09 DIAGNOSIS — J9601 Acute respiratory failure with hypoxia: Secondary | ICD-10-CM | POA: Diagnosis not present

## 2021-11-09 DIAGNOSIS — I4891 Unspecified atrial fibrillation: Secondary | ICD-10-CM | POA: Diagnosis not present

## 2021-11-09 LAB — BASIC METABOLIC PANEL
Anion gap: 9 (ref 5–15)
BUN: 31 mg/dL — ABNORMAL HIGH (ref 8–23)
CO2: 31 mmol/L (ref 22–32)
Calcium: 8.8 mg/dL — ABNORMAL LOW (ref 8.9–10.3)
Chloride: 101 mmol/L (ref 98–111)
Creatinine, Ser: 1.2 mg/dL — ABNORMAL HIGH (ref 0.44–1.00)
GFR, Estimated: 49 mL/min — ABNORMAL LOW (ref >60)
Glucose, Bld: 144 mg/dL — ABNORMAL HIGH (ref 70–99)
Potassium: 3.3 mmol/L — ABNORMAL LOW (ref 3.5–5.1)
Sodium: 141 mmol/L (ref 135–145)

## 2021-11-09 LAB — TSH: TSH: 34.988 u[IU]/mL — ABNORMAL HIGH (ref 0.350–4.500)

## 2021-11-09 LAB — T4, FREE: Free T4: 0.72 ng/dL (ref 0.61–1.12)

## 2021-11-09 LAB — MAGNESIUM: Magnesium: 2 mg/dL (ref 1.7–2.4)

## 2021-11-09 MED ORDER — FUROSEMIDE 40 MG PO TABS: 40.0000 mg | ORAL_TABLET | Freq: Every day | ORAL | Status: DC

## 2021-11-09 MED ORDER — POTASSIUM CHLORIDE 20 MEQ PO PACK
40.0000 meq | PACK | Freq: Two times a day (BID) | ORAL | Status: AC
Start: 2021-11-09 — End: 2021-11-09
  Administered 2021-11-09 (×2): 40 meq via ORAL
  Filled 2021-11-09 (×2): qty 2

## 2021-11-09 NOTE — Assessment & Plan Note (Addendum)
TSH 34, fT4 normal.  Given Afib, will defer exogenous LT4. - CHeck TSH as an outpatient

## 2021-11-09 NOTE — Plan of Care (Signed)
SATURATION QUALIFICATIONS: (This note is used to comply with regulatory documentation for home oxygen)  Patient Saturations on Room Air at Rest = 94%  Patient Saturations on Room Air while Ambulating = 90%  Patient Saturations on 0 Liters of oxygen while Ambulating = 90%  Please briefly explain why patient needs home oxygen: Pt is no longer a candidate for home O2    Problem: Education: Goal: Knowledge of General Education information will improve Description: Including pain rating scale, medication(s)/side effects and non-pharmacologic comfort measures Outcome: Progressing   Problem: Health Behavior/Discharge Planning: Goal: Ability to manage health-related needs will improve Outcome: Progressing   Problem: Clinical Measurements: Goal: Ability to maintain clinical measurements within normal limits will improve Outcome: Progressing Goal: Respiratory complications will improve Outcome: Progressing Goal: Cardiovascular complication will be avoided Outcome: Progressing   Problem: Activity: Goal: Risk for activity intolerance will decrease Outcome: Progressing   Problem: Nutrition: Goal: Adequate nutrition will be maintained Outcome: Progressing   Problem: Coping: Goal: Level of anxiety will decrease Outcome: Progressing   Problem: Elimination: Goal: Will not experience complications related to bowel motility Outcome: Progressing Goal: Will not experience complications related to urinary retention Outcome: Progressing   Problem: Pain Managment: Goal: General experience of comfort will improve Outcome: Progressing   Problem: Safety: Goal: Ability to remain free from injury will improve Outcome: Progressing   Problem: Skin Integrity: Goal: Risk for impaired skin integrity will decrease Outcome: Progressing   Problem: Education: Goal: Ability to demonstrate management of disease process will improve Outcome: Progressing Goal: Ability to verbalize understanding  of medication therapies will improve Outcome: Progressing Goal: Individualized Educational Video(s) Outcome: Progressing   Problem: Activity: Goal: Capacity to carry out activities will improve Outcome: Progressing   Problem: Cardiac: Goal: Ability to achieve and maintain adequate cardiopulmonary perfusion will improve Outcome: Progressing

## 2021-11-09 NOTE — Progress Notes (Signed)
  Progress Note   Patient: Brandi Stanton URK:[id number] DOB: 1955-09-21 DOA: 11/07/2021     1 DOS: the patient was seen and examined on 11/09/2021        Brief hospital course: Brandi Stanton is a 66 y.o. F with HTN, smoking, obesity who presented with progrsesive dyspnea, cough, and orthopnea.  In the ER, CXR showed cardiomegaly and pulmonary edema.  Started on diuretics and admitted.     Assessment and Plan: * Acute on chronic systolic CHF (congestive heart failure) (HCC) Prsented with dyspnea on exertion, orthopnea, cough, CXR with infiltrates and cardiomegaly.  Echo 7/14 showed EF 30-35%, new finding.  Net -500 cc yesterday, symptoms stable and apparently resolved, creatinine slightly up to 1.2, potassium down to 3.3.  Repeat chest x-ray without edema, still in atrial fibrillation, rates 110s. - Hold furosemide - Continue potassium  - Strict I/Os, daily weights, daily BMP - Consult Cardiology, appreciate cares - Continue metoprolol, lisinopril now, defer transition to GDMT to Cardiology - Plan for TEE DCCV early next week  Atrial fibrillation with RVR (HCC) New onset paroxysmal atrial fibrillation.  CHA2DS2-Vasc 4. - Continue new Eliquis - Continue metoprolol - Keep potassium greater than 4, magnesium greater than 2 - Plan for DCCV next week  Hypothyroidism TSH 34 in the setting of new onset reduced EF - Check free T4  Tobacco use Smoking cessation recommended  Acute respiratory failure with hypoxia (HCC) In the ER, patient hypoxic into the 80s, was tachypneic to the high 20s.  Required BiPAP.  Desaturated on room air yesterday, and desaturated to the mid 80s with walking yesterday.  She remains on 2 L, desaturates to the high 80s with ambulation on room air  Hypocalcemia Supplemented  Class 1 obesity BMI 31  Uncontrolled hypertension Blood pressure controlled - Continue furosemide, metoprolol and lisinopril, defer adjustment to  cardiology          Subjective: She has no swelling, no orthopnea, no chest pain, no dyspnea, no chest tightness Brandi Stanton     Physical Exam: Vitals:   11/09/21 0400 11/09/21 0432 11/09/21 0433 11/09/21 0820  BP:    116/83  Pulse: 76   98  Resp:      Temp:  98.7 F (37.1 C)    TempSrc:  Oral    SpO2: 94%   94%  Weight:   93.9 kg   Height:       Adult female, lying in bed, no acute distress, later ambulating in the hall with nursing Tachycardic, regular, I do not appreciate a murmur although her heart sounds are distant due to body habitus, no peripheral edema Respiratory rate normal, lungs clear without rales or wheezes Abdomen soft no tenderness palpation or guarding Attention normal, affect appropriate, judgment and insight appear normal  Data Reviewed: Potassium down to 3.3, creatinine up to 1.2 TSH 34 Echocardiogram shows EF 30 to 35% Discussed with cardiology Chest x-ray personally reviewed, shows resolved edema, no effusions      Disposition: Status is: Inpatient Patient was admitted for congestive heart failure and new onset atrial fibrillation.  She has been diuresed adequately, but she remains in A-fib, and cardiology recommended cardioversion later this week.        Author: Alberteen Sam, MD 11/09/2021 12:31 PM  For on call review [email or web address].

## 2021-11-09 NOTE — Plan of Care (Signed)
  Problem: Education: Goal: Knowledge of General Education information will improve Description: Including pain rating scale, medication(s)/side effects and non-pharmacologic comfort measures Outcome: Progressing   Problem: Health Behavior/Discharge Planning: Goal: Ability to manage health-related needs will improve Outcome: Progressing   Problem: Clinical Measurements: Goal: Ability to maintain clinical measurements within normal limits will improve Outcome: Progressing Goal: Will remain free from infection Outcome: Completed/Met

## 2021-11-09 NOTE — Progress Notes (Signed)
Progress Note  Patient Name: Brandi Stanton Date of Encounter: 11/09/2021  [facility] Cardiologist: Chrystie Nose, MD   Subjective   No acute events overnight. Feels that breathing is better, though still coughing. Tried to ambulate without O2 yesterday but desaturated, remains on O2 by nasal cannula. Spent extensive time discussing echo results, options for further treatment, see below.  Inpatient Medications    Scheduled Meds:  apixaban  5 mg Oral BID   lisinopril  2.5 mg Oral Daily   metoprolol tartrate  25 mg Oral BID   Continuous Infusions:  PRN Meds: acetaminophen **OR** acetaminophen, ipratropium, levalbuterol, nicotine, ondansetron **OR** ondansetron (ZOFRAN) IV   Vital Signs    Vitals:   11/09/21 0400 11/09/21 0432 11/09/21 0433 11/09/21 0820  BP:    116/83  Pulse: 76   98  Resp:      Temp:  98.7 F (37.1 C)    TempSrc:  Oral    SpO2: 94%   94%  Weight:   93.9 kg   Height:        Intake/Output Summary (Last 24 hours) at 11/09/2021 1143 Last data filed at 11/09/2021 0900 Gross per 24 hour  Intake 1020 ml  Output 2150 ml  Net -1130 ml      11/09/2021    4:33 AM 11/08/2021    5:00 AM 11/07/2021    9:16 AM  Last 3 Weights  Weight (lbs) 207 lb 0.2 oz 202 lb 9.6 oz 200 lb  Weight (kg) 93.9 kg 91.9 kg 90.719 kg      Telemetry    Atrial fibrillation, rates 100s-120s - Personally Reviewed  ECG    No new since 7/13 - Personally Reviewed  Physical Exam   GEN: No acute distress.   Neck: No JVD Cardiac: irregularly irregular, no murmurs, rubs, or gallops.  Respiratory: Largely clear to auscultation bilaterally, trivial crackles at the bases GI: Soft, nontender, non-distended  MS: No edema; No deformity. Neuro:  Nonfocal  Psych: Normal affect   Labs    High Sensitivity Troponin:   Recent Labs  Lab 11/07/21 1050 11/07/21 1448  TROPONINIHS 3 3     Chemistry Recent Labs  Lab 11/07/21 1050 11/08/21 0510 11/09/21 0500  NA 138 141  141  K 4.1 4.2 3.3*  CL 108 102 101  CO2 23 28 31   GLUCOSE 107* 191* 144*  BUN 15 22 31*  CREATININE 0.81 0.94 1.20*  CALCIUM 8.4* 8.6* 8.8*  MG  --   --  2.0  PROT  --  7.2  --   ALBUMIN  --  3.7  --   AST  --  59*  --   ALT  --  88*  --   ALKPHOS  --  63  --   BILITOT  --  0.9  --   GFRNONAA >60 >60 49*  ANIONGAP 7 11 9     Lipids No results for input(s): "CHOL", "TRIG", "HDL", "LABVLDL", "LDLCALC", "CHOLHDL" in the last 168 hours.  Hematology Recent Labs  Lab 11/07/21 1025  WBC 7.2  RBC 4.87  HGB 14.5  HCT 43.9  MCV 90.1  MCH 29.8  MCHC 33.0  RDW 15.4  PLT 219   Thyroid  Recent Labs  Lab 11/09/21 0500  TSH 34.988*    BNP Recent Labs  Lab 11/07/21 1025  BNP 237.2*    DDimer No results for input(s): "DDIMER" in the last 168 hours.   Radiology    ECHOCARDIOGRAM COMPLETE  Result Date: 11/08/2021    ECHOCARDIOGRAM REPORT   Patient Name:   Brandi Stanton Date of Exam: 11/08/2021 Medical Rec #:  [id number]        Height:       67.0 in Accession #:    [id number]       Weight:       202.6 lb Date of Birth:  1955-08-15        BSA:          2.033 m Patient Age:    66 years         BP:           121/85 mmHg Patient Gender: F                HR:           98 bpm. Exam Location:  Inpatient Procedure: 2D Echo, Cardiac Doppler and Color Doppler Indications:    Congestive Heart Failure I50.9                 Atrial Fibrillation I48.91  History:        Patient has no prior history of Echocardiogram examinations.                 Risk Factors:Hypertension.  Sonographer:    Eulah Pont RDCS Referring Phys: [id number] DAVID MANUEL ORTIZ IMPRESSIONS  1. Left ventricular ejection fraction, by estimation, is 30 to 35%. The left ventricle has moderately decreased function. The left ventricle demonstrates global hypokinesis. The left ventricular internal cavity size was moderately dilated. There is mild  left ventricular hypertrophy. Left ventricular diastolic parameters are indeterminate.   2. Right ventricular systolic function is normal. The right ventricular size is normal. There is normal pulmonary artery systolic pressure.  3. Left atrial size was mildly dilated.  4. The mitral valve is normal in structure. Mild mitral valve regurgitation. No evidence of mitral stenosis.  5. The aortic valve is normal in structure. Aortic valve regurgitation is not visualized. No aortic stenosis is present.  6. The inferior vena cava is normal in size with greater than 50% respiratory variability, suggesting right atrial pressure of 3 mmHg. FINDINGS  Left Ventricle: Left ventricular ejection fraction, by estimation, is 30 to 35%. The left ventricle has moderately decreased function. The left ventricle demonstrates global hypokinesis. The left ventricular internal cavity size was moderately dilated. There is mild left ventricular hypertrophy. Left ventricular diastolic parameters are indeterminate. Right Ventricle: The right ventricular size is normal. No increase in right ventricular wall thickness. Right ventricular systolic function is normal. There is normal pulmonary artery systolic pressure. The tricuspid regurgitant velocity is 2.38 m/s, and  with an assumed right atrial pressure of 3 mmHg, the estimated right ventricular systolic pressure is 25.7 mmHg. Left Atrium: Left atrial size was mildly dilated. Right Atrium: Right atrial size was normal in size. Pericardium: There is no evidence of pericardial effusion. Mitral Valve: The mitral valve is normal in structure. Mild mitral valve regurgitation. No evidence of mitral valve stenosis. Tricuspid Valve: The tricuspid valve is normal in structure. Tricuspid valve regurgitation is mild . No evidence of tricuspid stenosis. Aortic Valve: The aortic valve is normal in structure. Aortic valve regurgitation is not visualized. No aortic stenosis is present. Pulmonic Valve: The pulmonic valve was normal in structure. Pulmonic valve regurgitation is not visualized. No  evidence of pulmonic stenosis. Aorta: The aortic root is normal in size and structure. Venous: The inferior vena cava is normal in size  with greater than 50% respiratory variability, suggesting right atrial pressure of 3 mmHg. IAS/Shunts: No atrial level shunt detected by color flow Doppler.  LEFT VENTRICLE PLAX 2D LVIDd:         6.00 cm LVIDs:         5.10 cm LV PW:         1.30 cm LV IVS:        1.10 cm LVOT diam:     2.00 cm LV SV:         39 LV SV Index:   19 LVOT Area:     3.14 cm  LV Volumes (MOD) LV vol d, MOD A2C: 75.6 ml LV vol d, MOD A4C: 94.7 ml LV vol s, MOD A2C: 49.6 ml LV vol s, MOD A4C: 58.7 ml LV SV MOD A2C:     26.0 ml LV SV MOD A4C:     94.7 ml LV SV MOD BP:      30.3 ml RIGHT VENTRICLE RV S prime:     7.37 cm/s TAPSE (M-mode): 1.2 cm LEFT ATRIUM             Index        RIGHT ATRIUM           Index LA diam:        5.00 cm 2.46 cm/m   RA Area:     21.10 cm LA Vol (A2C):   54.3 ml 26.70 ml/m  RA Volume:   61.50 ml  30.25 ml/m LA Vol (A4C):   60.3 ml 29.65 ml/m LA Biplane Vol: 59.8 ml 29.41 ml/m  AORTIC VALVE LVOT Vmax:   80.07 cm/s LVOT Vmean:  51.300 cm/s LVOT VTI:    0.126 m  AORTA Ao Root diam: 3.10 cm Ao Asc diam:  3.80 cm MR Peak grad:    67.2 mmHg    TRICUSPID VALVE MR Mean grad:    48.0 mmHg    TR Peak grad:   22.7 mmHg MR Vmax:         410.00 cm/s  TR Vmax:        238.00 cm/s MR Vmean:        331.0 cm/s MR PISA:         1.01 cm     SHUNTS MR PISA Eff ROA: 9 mm        Systemic VTI:  0.13 m MR PISA Radius:  0.40 cm      Systemic Diam: 2.00 cm Candee Furbish MD Electronically signed by Candee Furbish MD Signature Date/Time: 11/08/2021/2:09:43 PM    Final     Cardiac Studies   Echo 11/08/21 1. Left ventricular ejection fraction, by estimation, is 30 to 35%. The  left ventricle has moderately decreased function. The left ventricle  demonstrates global hypokinesis. The left ventricular internal cavity size  was moderately dilated. There is mild   left ventricular hypertrophy. Left  ventricular diastolic parameters are  indeterminate.   2. Right ventricular systolic function is normal. The right ventricular  size is normal. There is normal pulmonary artery systolic pressure.   3. Left atrial size was mildly dilated.   4. The mitral valve is normal in structure. Mild mitral valve  regurgitation. No evidence of mitral stenosis.   5. The aortic valve is normal in structure. Aortic valve regurgitation is  not visualized. No aortic stenosis is present.   6. The inferior vena cava is normal in size with greater than 50%  respiratory variability, suggesting right  atrial pressure of 3 mmHg.   Patient Profile     66 y.o. female with PMH hypertension, tobacco abuse presenting with dyspnea, orthopnea. Found to be in new afib and new systolic and diastolic heart failure  Assessment & Plan    Atrial fibrillation, new -CHA2DS2/VAS Stroke Risk Points = 4 -started on apixaban this admission -given reduced EF, recommend TEE-CV this admission -currently on metoprolol tartrate for rate control, consolidate to succinate prior to discharge  Shared Decision Making/Informed Consent The risks [stroke, cardiac arrhythmias rarely resulting in the need for a temporary or permanent pacemaker, skin irritation or burns, esophageal damage, perforation (1:10,000 risk), bleeding, pharyngeal hematoma as well as other potential complications associated with conscious sedation including aspiration, arrhythmia, respiratory failure and death], benefits (treatment guidance, restoration of normal sinus rhythm, diagnostic support) and alternatives of a transesophageal echocardiogram guided cardioversion were discussed in detail with Ms. Soliz .  She would like to discuss again tomorrow with her son present, will plan for meeting at 8 AM.  Acute systolic and diastolic heart failure -presented with pulmonary edema, dyspnea, orthopnea -echo as above, new finding of reduced EF -will need ischemic  evaluation as outpatient, but focus is on afib at this time, as above -metoprolol as above -started on lisinopril. BP borderline. Ideally would like to work towards [employer], SGLT2i, and MRA but limited by BP at this time -Cr bumped today to 1.2 -weights and I/O do not agree. Admission wight 90.7 kg, currently weight 93.9 kg, but listed net negative 1 L. -CXR with improving pulmonary edema on my review -overall she appears to be nearly euvolemic. Agree with holding diuretic  Tobacco abuse: counseled on need for cessation, she plans to quit  For questions or updates, please contact [place] Please consult [email or web address] for contact info under        Signed, Buford Dresser, MD  11/09/2021, 11:43 AM

## 2021-11-10 DIAGNOSIS — J9601 Acute respiratory failure with hypoxia: Secondary | ICD-10-CM | POA: Diagnosis not present

## 2021-11-10 DIAGNOSIS — I4891 Unspecified atrial fibrillation: Secondary | ICD-10-CM | POA: Diagnosis not present

## 2021-11-10 DIAGNOSIS — I5041 Acute combined systolic (congestive) and diastolic (congestive) heart failure: Secondary | ICD-10-CM | POA: Diagnosis not present

## 2021-11-10 DIAGNOSIS — I5023 Acute on chronic systolic (congestive) heart failure: Secondary | ICD-10-CM | POA: Diagnosis not present

## 2021-11-10 DIAGNOSIS — Z720 Tobacco use: Secondary | ICD-10-CM | POA: Diagnosis not present

## 2021-11-10 LAB — BASIC METABOLIC PANEL
Anion gap: 8 (ref 5–15)
BUN: 28 mg/dL — ABNORMAL HIGH (ref 8–23)
CO2: 26 mmol/L (ref 22–32)
Calcium: 8.3 mg/dL — ABNORMAL LOW (ref 8.9–10.3)
Chloride: 103 mmol/L (ref 98–111)
Creatinine, Ser: 0.93 mg/dL (ref 0.44–1.00)
GFR, Estimated: >60 mL/min (ref >60)
Glucose, Bld: 140 mg/dL — ABNORMAL HIGH (ref 70–99)
Potassium: 3.9 mmol/L (ref 3.5–5.1)
Sodium: 137 mmol/L (ref 135–145)

## 2021-11-10 MED ORDER — ORAL CARE MOUTH RINSE: 15.0000 mL | OROMUCOSAL | Status: DC | PRN

## 2021-11-10 NOTE — Progress Notes (Signed)
Progress Note  Patient Name: Brandi Stanton Date of Encounter: 11/10/2021  [facility] Cardiologist: Chrystie Nose, MD   Subjective   Breathing is somewhat better today, having a lot of sinus drainage and some wheezing but off O2. Son is at bedside. We reviewed echo findings, afib, and discussed TEE-CV, see below.  Inpatient Medications    Scheduled Meds:  apixaban  5 mg Oral BID   lisinopril  2.5 mg Oral Daily   metoprolol tartrate  25 mg Oral BID   Continuous Infusions:  PRN Meds: acetaminophen **OR** acetaminophen, ipratropium, levalbuterol, nicotine, ondansetron **OR** ondansetron (ZOFRAN) IV   Vital Signs    Vitals:   11/09/21 1329 11/09/21 2053 11/09/21 2147 11/10/21 0449  BP: (!) 141/96 105/78 118/81 114/90  Pulse: 100 (!) 102 92 100  Resp: 18 19  19   Temp: 98.8 F (37.1 C) 98.2 F (36.8 C)  98.6 F (37 C)  TempSrc: Oral Oral  Oral  SpO2: 92% 95%  94%  Weight:    94.3 kg  Height:        Intake/Output Summary (Last 24 hours) at 11/10/2021 0828 Last data filed at 11/10/2021 0453 Gross per 24 hour  Intake 440 ml  Output 1351 ml  Net -911 ml      11/10/2021    4:49 AM 11/09/2021    4:33 AM 11/08/2021    5:00 AM  Last 3 Weights  Weight (lbs) 207 lb 14.3 oz 207 lb 0.2 oz 202 lb 9.6 oz  Weight (kg) 94.3 kg 93.9 kg 91.9 kg      Telemetry    Atrial fibrillation, rates 90s-110 - Personally Reviewed  ECG    No new since 7/13 - Personally Reviewed  Physical Exam   GEN: Well nourished, well developed in no acute distress HEENT: Normal, moist mucous membranes NECK: No JVD CARDIAC: irregularly irregular rhythm, normal S1 and S2, no rubs or gallops. No murmur. VASCULAR: Radial and DP pulses 2+ bilaterally. No carotid bruits RESPIRATORY:  largely clear, no rales, but diffuse mild wheezing ABDOMEN: Soft, non-tender, non-distended MUSCULOSKELETAL:  Ambulates independently SKIN: Warm and dry, no edema NEUROLOGIC:  Alert and oriented x 3. No focal  neuro deficits noted. PSYCHIATRIC:  Normal affect    Labs    High Sensitivity Troponin:   Recent Labs  Lab 11/07/21 1050 11/07/21 1448  TROPONINIHS 3 3     Chemistry Recent Labs  Lab 11/08/21 0510 11/09/21 0500 11/10/21 0416  NA 141 141 137  K 4.2 3.3* 3.9  CL 102 101 103  CO2 28 31 26   GLUCOSE 191* 144* 140*  BUN 22 31* 28*  CREATININE 0.94 1.20* 0.93  CALCIUM 8.6* 8.8* 8.3*  MG  --  2.0  --   PROT 7.2  --   --   ALBUMIN 3.7  --   --   AST 59*  --   --   ALT 88*  --   --   ALKPHOS 63  --   --   BILITOT 0.9  --   --   GFRNONAA >60 49* >60  ANIONGAP 11 9 8     Lipids No results for input(s): "CHOL", "TRIG", "HDL", "LABVLDL", "LDLCALC", "CHOLHDL" in the last 168 hours.  Hematology Recent Labs  Lab 11/07/21 1025  WBC 7.2  RBC 4.87  HGB 14.5  HCT 43.9  MCV 90.1  MCH 29.8  MCHC 33.0  RDW 15.4  PLT 219   Thyroid  Recent Labs  Lab 11/09/21 0500  TSH 34.988*  FREET4 0.72    BNP Recent Labs  Lab 11/07/21 1025  BNP 237.2*    DDimer No results for input(s): "DDIMER" in the last 168 hours.   Radiology    DG CHEST PORT 1 VIEW  Result Date: 11/09/2021 CLINICAL DATA:  Hypertension EXAM: PORTABLE CHEST 1 VIEW COMPARISON:  11/07/2021 FINDINGS: Stable cardiomediastinal contours. Low lung volumes. Streaky bibasilar opacities, left greater than right, similar to prior. Overall decreased interstitial prominence bilaterally. No large pleural fluid collection. No pneumothorax. IMPRESSION: Persistent low lung volumes with streaky bibasilar opacities, left greater than right. Overall decreased interstitial prominence bilaterally, could reflect improving edema. Electronically Signed   By: Davina Poke D.O.   On: 11/09/2021 12:25   ECHOCARDIOGRAM COMPLETE  Result Date: 11/08/2021    ECHOCARDIOGRAM REPORT   Patient Name:   Brandi Stanton Date of Exam: 11/08/2021 Medical Rec #:  [id number]        Height:       67.0 in Accession #:    [id number]       Weight:        202.6 lb Date of Birth:  May 16, 1955        BSA:          2.033 m Patient Age:    66 years         BP:           121/85 mmHg Patient Gender: F                HR:           98 bpm. Exam Location:  Inpatient Procedure: 2D Echo, Cardiac Doppler and Color Doppler Indications:    Congestive Heart Failure I50.9                 Atrial Fibrillation I48.91  History:        Patient has no prior history of Echocardiogram examinations.                 Risk Factors:Hypertension.  Sonographer:    Bernadene Person RDCS Referring Phys: [id number] [place]  1. Left ventricular ejection fraction, by estimation, is 30 to 35%. The left ventricle has moderately decreased function. The left ventricle demonstrates global hypokinesis. The left ventricular internal cavity size was moderately dilated. There is mild  left ventricular hypertrophy. Left ventricular diastolic parameters are indeterminate.  2. Right ventricular systolic function is normal. The right ventricular size is normal. There is normal pulmonary artery systolic pressure.  3. Left atrial size was mildly dilated.  4. The mitral valve is normal in structure. Mild mitral valve regurgitation. No evidence of mitral stenosis.  5. The aortic valve is normal in structure. Aortic valve regurgitation is not visualized. No aortic stenosis is present.  6. The inferior vena cava is normal in size with greater than 50% respiratory variability, suggesting right atrial pressure of 3 mmHg. FINDINGS  Left Ventricle: Left ventricular ejection fraction, by estimation, is 30 to 35%. The left ventricle has moderately decreased function. The left ventricle demonstrates global hypokinesis. The left ventricular internal cavity size was moderately dilated. There is mild left ventricular hypertrophy. Left ventricular diastolic parameters are indeterminate. Right Ventricle: The right ventricular size is normal. No increase in right ventricular wall thickness. Right ventricular systolic  function is normal. There is normal pulmonary artery systolic pressure. The tricuspid regurgitant velocity is 2.38 m/s, and  with an assumed right atrial pressure of 3 mmHg, the estimated right  ventricular systolic pressure is 25.7 mmHg. Left Atrium: Left atrial size was mildly dilated. Right Atrium: Right atrial size was normal in size. Pericardium: There is no evidence of pericardial effusion. Mitral Valve: The mitral valve is normal in structure. Mild mitral valve regurgitation. No evidence of mitral valve stenosis. Tricuspid Valve: The tricuspid valve is normal in structure. Tricuspid valve regurgitation is mild . No evidence of tricuspid stenosis. Aortic Valve: The aortic valve is normal in structure. Aortic valve regurgitation is not visualized. No aortic stenosis is present. Pulmonic Valve: The pulmonic valve was normal in structure. Pulmonic valve regurgitation is not visualized. No evidence of pulmonic stenosis. Aorta: The aortic root is normal in size and structure. Venous: The inferior vena cava is normal in size with greater than 50% respiratory variability, suggesting right atrial pressure of 3 mmHg. IAS/Shunts: No atrial level shunt detected by color flow Doppler.  LEFT VENTRICLE PLAX 2D LVIDd:         6.00 cm LVIDs:         5.10 cm LV PW:         1.30 cm LV IVS:        1.10 cm LVOT diam:     2.00 cm LV SV:         39 LV SV Index:   19 LVOT Area:     3.14 cm  LV Volumes (MOD) LV vol d, MOD A2C: 75.6 ml LV vol d, MOD A4C: 94.7 ml LV vol s, MOD A2C: 49.6 ml LV vol s, MOD A4C: 58.7 ml LV SV MOD A2C:     26.0 ml LV SV MOD A4C:     94.7 ml LV SV MOD BP:      30.3 ml RIGHT VENTRICLE RV S prime:     7.37 cm/s TAPSE (M-mode): 1.2 cm LEFT ATRIUM             Index        RIGHT ATRIUM           Index LA diam:        5.00 cm 2.46 cm/m   RA Area:     21.10 cm LA Vol (A2C):   54.3 ml 26.70 ml/m  RA Volume:   61.50 ml  30.25 ml/m LA Vol (A4C):   60.3 ml 29.65 ml/m LA Biplane Vol: 59.8 ml 29.41 ml/m  AORTIC  VALVE LVOT Vmax:   80.07 cm/s LVOT Vmean:  51.300 cm/s LVOT VTI:    0.126 m  AORTA Ao Root diam: 3.10 cm Ao Asc diam:  3.80 cm MR Peak grad:    67.2 mmHg    TRICUSPID VALVE MR Mean grad:    48.0 mmHg    TR Peak grad:   22.7 mmHg MR Vmax:         410.00 cm/s  TR Vmax:        238.00 cm/s MR Vmean:        331.0 cm/s MR PISA:         1.01 cm     SHUNTS MR PISA Eff ROA: 9 mm        Systemic VTI:  0.13 m MR PISA Radius:  0.40 cm      Systemic Diam: 2.00 cm Donato Schultz MD Electronically signed by Donato Schultz MD Signature Date/Time: 11/08/2021/2:09:43 PM    Final     Cardiac Studies   Echo 11/08/21 1. Left ventricular ejection fraction, by estimation, is 30 to 35%. The  left ventricle has  moderately decreased function. The left ventricle  demonstrates global hypokinesis. The left ventricular internal cavity size  was moderately dilated. There is mild   left ventricular hypertrophy. Left ventricular diastolic parameters are  indeterminate.   2. Right ventricular systolic function is normal. The right ventricular  size is normal. There is normal pulmonary artery systolic pressure.   3. Left atrial size was mildly dilated.   4. The mitral valve is normal in structure. Mild mitral valve  regurgitation. No evidence of mitral stenosis.   5. The aortic valve is normal in structure. Aortic valve regurgitation is  not visualized. No aortic stenosis is present.   6. The inferior vena cava is normal in size with greater than 50%  respiratory variability, suggesting right atrial pressure of 3 mmHg.   Patient Profile     66 y.o. female with PMH hypertension, tobacco abuse presenting with dyspnea, orthopnea. Found to be in new afib and new systolic and diastolic heart failure  Assessment & Plan    Atrial fibrillation, new -CHA2DS2/VAS Stroke Risk Points = 4 -started on apixaban this admission -given reduced EF, recommend TEE-CV this admission -currently on metoprolol tartrate for rate control, consolidate  to succinate prior to discharge  Shared Decision Making/Informed Consent The risks [stroke, cardiac arrhythmias rarely resulting in the need for a temporary or permanent pacemaker, skin irritation or burns, esophageal damage, perforation (1:10,000 risk), bleeding, pharyngeal hematoma as well as other potential complications associated with conscious sedation including aspiration, arrhythmia, respiratory failure and death], benefits (treatment guidance, restoration of normal sinus rhythm, diagnostic support) and alternatives of a transesophageal echocardiogram guided cardioversion were discussed in detail with Ms. Doshier and her son. They wish to proceed.  I discussed that I do not control the schedule, but will request TEE-CV for tomorrow, place orders, and make NPO at midnight tonight.  Acute systolic and diastolic heart failure -presented with pulmonary edema, dyspnea, orthopnea -echo as above, new finding of reduced EF -will need ischemic evaluation as outpatient, but focus is on afib at this time, as above -metoprolol as above -started on lisinopril. BP borderline. Ideally would like to work towards [employer], SGLT2i, and MRA but limited by BP at this time -Cr bumped today to 1.2 -weights and I/O do not agree. Admission wight 90.7 kg, currently weight 94.3 kg, but listed net negative 2 L. -CXR with improving pulmonary edema on my review -she appears euvolemic, off O2, but does have diffuse mild wheezing on exam. Defer to primary but may need inhaler.  Of note, TSH elevated but fT4 normal. Defer to primary team, but wouldn't treat TSH at this time with normal T4 given risk of exacerbating afib. Would recheck TSH as outpatient.  Tobacco abuse: counseled on need for cessation, she plans to quit  For questions or updates, please contact [place] Please consult [email or web address] for contact info under        Signed, Buford Dresser, MD  11/10/2021, 8:28 AM

## 2021-11-10 NOTE — Progress Notes (Signed)
  Progress Note   Patient: Brandi Stanton [id number] DOB: 1955/11/03 DOA: 11/07/2021     2 DOS: the patient was seen and examined on 11/10/2021        Brief hospital course: Brandi Stanton is a 66 y.o. F with HTN, smoking, obesity who presented with progrsesive dyspnea, cough, and orthopnea.  In the ER, CXR showed cardiomegaly and pulmonary edema.  Started on diuretics and admitted.     Assessment and Plan: * Acute on chronic systolic CHF (congestive heart failure) (HCC) Prsented with dyspnea on exertion, orthopnea, cough, CXR with infiltrates and cardiomegaly.  Echo 7/14 showed EF 30-35%, new finding.  Net negative 500cc again, recorded weights incongruent.  Symptoms stably improved. Still in Afib, but rates better.  Cr improved, K normal - Hold furosemide and K - Strict I/Os, daily weights, daily BMP - Consult Cardiology, appreciate cares - Continue metoprolol, lisinopril now, defer transition to GDMT to Cardiology - Plan for TEE DCCV early next week  Atrial fibrillation with RVR (HCC) New onset paroxysmal atrial fibrillation.  CHA2DS2-Vasc 4.  Tele rviewed, still in Afib. - Continue new Eliquis - Continue metoprolol - Keep potassium greater than 4, magnesium greater than 2 - Plan for DCCV next week  Subclinical hypothyroidism TSH 34, fT4 normal.  Given Afib, will defer exogenous LT4. - CHeck TSH as an outpatient    Uncontrolled hypertension Blood pressure controlled - Continue metoprolol and lisinopril          Subjective: No orthopnea, no chest pain, no palpitaations, no dyspnea, no cough, no wheezing.     Physical Exam: Vitals:   11/09/21 2147 11/10/21 0449 11/10/21 0815 11/10/21 1259  BP: 118/81 114/90  (!) 119/95  Pulse: 92 100  95  Resp:  19  18  Temp:  98.6 F (37 C)  (!) 97.4 F (36.3 C)  TempSrc:  Oral    SpO2:  94%  92%  Weight:  94.3 kg 95.2 kg   Height:       Adult female, sitting up in bed, no acute distress Irregularly irregular,  no murmurs, no peripheral edema Respiratory rate normal, lungs clear without rales or wheezes Abdomen soft without tenderness palpation or guarding, no ascites or distention Attention normal, affect appropriate, judgment and insight normal, face symmetric, speech  Data Reviewed: Discussed with cardiology Creatinine now back down to 0.93 Potassium and sodium normal Free T4 normal      Disposition: Status is: Inpatient The patient was admitted with CHF and new onset atrial fibrillation.  Cardiology recommended inpatient cardioversion, which is planned for tomorrow, likely home after        Author: Alberteen Sam, MD 11/10/2021 1:47 PM  For on call review [email or web address].

## 2021-11-10 NOTE — Plan of Care (Signed)
  Problem: Health Behavior/Discharge Planning: Goal: Ability to manage health-related needs will improve Outcome: Progressing   Problem: Clinical Measurements: Goal: Ability to maintain clinical measurements within normal limits will improve Outcome: Progressing Goal: Respiratory complications will improve Outcome: Progressing Goal: Cardiovascular complication will be avoided Outcome: Progressing Note: Plans for TEE DCCV tomorrow   Problem: Education: Goal: Ability to demonstrate management of disease process will improve Outcome: Progressing Goal: Ability to verbalize understanding of medication therapies will improve Outcome: Progressing   Problem: Activity: Goal: Capacity to carry out activities will improve Outcome: Progressing   Problem: Cardiac: Goal: Ability to achieve and maintain adequate cardiopulmonary perfusion will improve Outcome: Progressing   Problem: Education: Goal: Knowledge of General Education information will improve Description: Including pain rating scale, medication(s)/side effects and non-pharmacologic comfort measures Outcome: Completed/Met   Problem: Activity: Goal: Risk for activity intolerance will decrease Outcome: Completed/Met   Problem: Nutrition: Goal: Adequate nutrition will be maintained Outcome: Completed/Met   Problem: Coping: Goal: Level of anxiety will decrease Outcome: Completed/Met   Problem: Elimination: Goal: Will not experience complications related to bowel motility Outcome: Completed/Met Goal: Will not experience complications related to urinary retention Outcome: Completed/Met   Problem: Pain Managment: Goal: General experience of comfort will improve Outcome: Completed/Met   Problem: Safety: Goal: Ability to remain free from injury will improve Outcome: Completed/Met   Problem: Skin Integrity: Goal: Risk for impaired skin integrity will decrease Outcome: Completed/Met   Problem: Education: Goal:  Individualized Educational Video(s) Outcome: Not Applicable

## 2021-11-11 ENCOUNTER — Encounter (HOSPITAL_COMMUNITY): Payer: Self-pay | Admitting: Family Medicine

## 2021-11-11 ENCOUNTER — Encounter (HOSPITAL_COMMUNITY)
Admission: EM | Disposition: A | Discharge status: Home / Self Care | Payer: Self-pay | Source: Home / Self Care | Attending: Family Medicine

## 2021-11-11 ENCOUNTER — Other Ambulatory Visit (HOSPITAL_COMMUNITY): Payer: Self-pay

## 2021-11-11 ENCOUNTER — Inpatient Hospital Stay (HOSPITAL_COMMUNITY): Payer: Medicare Other | Admitting: Anesthesiology

## 2021-11-11 ENCOUNTER — Inpatient Hospital Stay (HOSPITAL_COMMUNITY): Payer: Medicare Other

## 2021-11-11 DIAGNOSIS — I361 Nonrheumatic tricuspid (valve) insufficiency: Secondary | ICD-10-CM | POA: Diagnosis not present

## 2021-11-11 DIAGNOSIS — I509 Heart failure, unspecified: Secondary | ICD-10-CM | POA: Diagnosis not present

## 2021-11-11 DIAGNOSIS — I081 Rheumatic disorders of both mitral and tricuspid valves: Secondary | ICD-10-CM | POA: Diagnosis not present

## 2021-11-11 DIAGNOSIS — I340 Nonrheumatic mitral (valve) insufficiency: Secondary | ICD-10-CM

## 2021-11-11 DIAGNOSIS — I110 Hypertensive heart disease with heart failure: Secondary | ICD-10-CM | POA: Diagnosis not present

## 2021-11-11 DIAGNOSIS — I4891 Unspecified atrial fibrillation: Secondary | ICD-10-CM

## 2021-11-11 DIAGNOSIS — F17210 Nicotine dependence, cigarettes, uncomplicated: Secondary | ICD-10-CM

## 2021-11-11 DIAGNOSIS — I517 Cardiomegaly: Secondary | ICD-10-CM

## 2021-11-11 DIAGNOSIS — J9601 Acute respiratory failure with hypoxia: Secondary | ICD-10-CM | POA: Diagnosis not present

## 2021-11-11 DIAGNOSIS — E669 Obesity, unspecified: Secondary | ICD-10-CM | POA: Diagnosis not present

## 2021-11-11 DIAGNOSIS — I5023 Acute on chronic systolic (congestive) heart failure: Secondary | ICD-10-CM | POA: Diagnosis not present

## 2021-11-11 DIAGNOSIS — E876 Hypokalemia: Secondary | ICD-10-CM

## 2021-11-11 HISTORY — PX: CARDIOVERSION: SHX1299

## 2021-11-11 HISTORY — PX: TEE WITHOUT CARDIOVERSION: SHX5443

## 2021-11-11 LAB — BASIC METABOLIC PANEL
Anion gap: 9 (ref 5–15)
BUN: 23 mg/dL (ref 8–23)
CO2: 27 mmol/L (ref 22–32)
Calcium: 8.9 mg/dL (ref 8.9–10.3)
Chloride: 103 mmol/L (ref 98–111)
Creatinine, Ser: 1.09 mg/dL — ABNORMAL HIGH (ref 0.44–1.00)
GFR, Estimated: 55 mL/min — ABNORMAL LOW (ref >60)
Glucose, Bld: 146 mg/dL — ABNORMAL HIGH (ref 70–99)
Potassium: 3.8 mmol/L (ref 3.5–5.1)
Sodium: 139 mmol/L (ref 135–145)

## 2021-11-11 LAB — CBC
HCT: 46.3 % — ABNORMAL HIGH (ref 36.0–46.0)
Hemoglobin: 15.1 g/dL — ABNORMAL HIGH (ref 12.0–15.0)
MCH: 29.3 pg (ref 26.0–34.0)
MCHC: 32.6 g/dL (ref 30.0–36.0)
MCV: 89.9 fL (ref 80.0–100.0)
Platelets: 252 10*3/uL (ref 150–400)
RBC: 5.15 MIL/uL — ABNORMAL HIGH (ref 3.87–5.11)
RDW: 14.9 % (ref 11.5–15.5)
WBC: 7.1 10*3/uL (ref 4.0–10.5)
nRBC: 0 % (ref 0.0–0.2)

## 2021-11-11 SURGERY — ECHOCARDIOGRAM, TRANSESOPHAGEAL
Anesthesia: Monitor Anesthesia Care

## 2021-11-11 MED ORDER — PROPOFOL 500 MG/50ML IV EMUL
INTRAVENOUS | Status: DC | PRN
  Administered 2021-11-11: 75 ug/kg/min via INTRAVENOUS

## 2021-11-11 MED ORDER — LOSARTAN POTASSIUM 25 MG PO TABS: 25.0000 mg | ORAL_TABLET | Freq: Every day | ORAL | Status: DC

## 2021-11-11 MED ORDER — LOSARTAN POTASSIUM 25 MG PO TABS
25.0000 mg | ORAL_TABLET | Freq: Every day | ORAL | Status: DC
Start: 2021-11-11 — End: 2021-11-11

## 2021-11-11 MED ORDER — AMIODARONE HCL IN DEXTROSE 360-4.14 MG/200ML-% IV SOLN
60.0000 mg/h | INTRAVENOUS | Status: AC
  Administered 2021-11-11: 60 mg/h via INTRAVENOUS
  Filled 2021-11-11: qty 200

## 2021-11-11 MED ORDER — SODIUM CHLORIDE 0.9 % IV SOLN: INTRAVENOUS | Status: DC

## 2021-11-11 MED ORDER — APIXABAN 5 MG PO TABS
5.0000 mg | ORAL_TABLET | Freq: Two times a day (BID) | ORAL | 0 refills | Status: DC
Start: 2021-11-11 — End: 2021-11-12
  Filled 2021-11-11: qty 60, 30d supply, fill #0

## 2021-11-11 MED ORDER — GLYCOPYRROLATE 0.2 MG/ML IJ SOLN
INTRAMUSCULAR | Status: DC | PRN
  Administered 2021-11-11: .2 mg via INTRAVENOUS

## 2021-11-11 MED ORDER — AMIODARONE HCL IN DEXTROSE 360-4.14 MG/200ML-% IV SOLN
30.0000 mg/h | INTRAVENOUS | Status: DC
  Administered 2021-11-11 – 2021-11-12 (×2): 30 mg/h via INTRAVENOUS
  Filled 2021-11-11 (×3): qty 200

## 2021-11-11 MED ORDER — AMIODARONE LOAD VIA INFUSION
150.0000 mg | Freq: Once | INTRAVENOUS | Status: AC
  Administered 2021-11-11: 150 mg via INTRAVENOUS
  Filled 2021-11-11: qty 83.34

## 2021-11-11 MED ORDER — PROPOFOL 10 MG/ML IV BOLUS
INTRAVENOUS | Status: DC | PRN
  Administered 2021-11-11: 50 mg via INTRAVENOUS

## 2021-11-11 NOTE — Transfer of Care (Signed)
Immediate Anesthesia Transfer of Care Note  Patient: Brandi Stanton  Procedure(s) Performed: TRANSESOPHAGEAL ECHOCARDIOGRAM (TEE) CARDIOVERSION  Patient Location: PACU and Endoscopy Unit  Anesthesia Type:MAC  Level of Consciousness: awake, alert , oriented and patient cooperative  Airway & Oxygen Therapy: Patient Spontanous Breathing and Patient connected to nasal cannula oxygen  Post-op Assessment: Report given to RN and Post -op Vital signs reviewed and stable  Post vital signs: Reviewed and stable  Last Vitals:  Vitals Value Taken Time  BP    Temp    Pulse 112 11/11/21 1416  Resp 23 11/11/21 1416  SpO2 93 % 11/11/21 1416  Vitals shown include unvalidated device data.  Last Pain:  Vitals:   11/11/21 1240  TempSrc: Temporal  PainSc: 0-No pain      Patients Stated Pain Goal: 0 (28/11/88 6773)  Complications: No notable events documented.

## 2021-11-11 NOTE — Anesthesia Postprocedure Evaluation (Signed)
Anesthesia Post Note  Patient: Brandi Stanton  Procedure(s) Performed: TRANSESOPHAGEAL ECHOCARDIOGRAM (TEE) CARDIOVERSION     Patient location during evaluation: PACU Anesthesia Type: MAC Level of consciousness: awake and alert Pain management: pain level controlled Vital Signs Assessment: post-procedure vital signs reviewed and stable Respiratory status: spontaneous breathing Cardiovascular status: stable Anesthetic complications: no   No notable events documented.  Last Vitals:  Vitals:   11/11/21 1416 11/11/21 1426  BP: (!) 114/93 (!) 128/98  Pulse: (!) 112 (!) 104  Resp: (!) 23 20  Temp: (!) 36.3 C   SpO2: 93% 95%    Last Pain:  Vitals:   11/11/21 1426  TempSrc:   PainSc: 0-No pain                 Nolon Nations

## 2021-11-11 NOTE — Progress Notes (Signed)
  Echocardiogram Echocardiogram Transesophageal has been performed.  Janalyn Harder 11/11/2021, 2:14 PM

## 2021-11-11 NOTE — Progress Notes (Signed)
Timeout actually performed at 1356.

## 2021-11-11 NOTE — Progress Notes (Addendum)
 Progress Note  Patient Name: Brandi Stanton Date of Encounter: 11/11/2021  [facility] Cardiologist: Kenneth C Hilty, MD   Subjective   Plan for TEE/DCCV today.  Patient denies dyspnea or chest pain  Inpatient Medications    Scheduled Meds:  apixaban  5 mg Oral BID   lisinopril  2.5 mg Oral Daily   metoprolol tartrate  25 mg Oral BID   Continuous Infusions:  PRN Meds: acetaminophen **OR** acetaminophen, ipratropium, levalbuterol, nicotine, ondansetron **OR** ondansetron (ZOFRAN) IV, mouth rinse   Vital Signs    Vitals:   11/10/21 1259 11/10/21 2137 11/11/21 0428 11/11/21 0431  BP: (!) 119/95 (!) 111/93 (!) 137/103   Pulse: 95 87 96   Resp: 18 20 20   Temp: (!) 97.4 F (36.3 C) 98.4 F (36.9 C) 98.1 F (36.7 C)   TempSrc:  Oral Oral   SpO2: 92% 95% 92%   Weight:    94.8 kg  Height:        Intake/Output Summary (Last 24 hours) at 11/11/2021 0751 Last data filed at 11/10/2021 1800 Gross per 24 hour  Intake 960 ml  Output --  Net 960 ml      11/11/2021    4:31 AM 11/10/2021    8:15 AM 11/10/2021    4:49 AM  Last 3 Weights  Weight (lbs) 208 lb 15.9 oz 209 lb 12.8 oz 207 lb 14.3 oz  Weight (kg) 94.8 kg 95.165 kg 94.3 kg      Telemetry    Afib Hr 80s - Personally Reviewed  ECG    Afib 91bpm, nonspecific T wave changes - Personally Reviewed  Physical Exam   GEN: No acute distress.   Neck: No JVD Cardiac: Irreg Irreg, no murmurs, rubs, or gallops.  Respiratory: Clear to auscultation bilaterally. GI: Soft, nontender, non-distended  MS: No edema; No deformity. Neuro:  Nonfocal  Psych: Normal affect   Labs    High Sensitivity Troponin:   Recent Labs  Lab 11/07/21 1050 11/07/21 1448  TROPONINIHS 3 3     Chemistry Recent Labs  Lab 11/08/21 0510 11/09/21 0500 11/10/21 0416 11/11/21 0423  NA 141 141 137 139  K 4.2 3.3* 3.9 3.8  CL 102 101 103 103  CO2 28 31 26 27  GLUCOSE 191* 144* 140* 146*  BUN 22 31* 28* 23  CREATININE 0.94 1.20*  0.93 1.09*  CALCIUM 8.6* 8.8* 8.3* 8.9  MG  --  2.0  --   --   PROT 7.2  --   --   --   ALBUMIN 3.7  --   --   --   AST 59*  --   --   --   ALT 88*  --   --   --   ALKPHOS 63  --   --   --   BILITOT 0.9  --   --   --   GFRNONAA >60 49* >60 55*  ANIONGAP 11 9 8 9    Lipids No results for input(s): "CHOL", "TRIG", "HDL", "LABVLDL", "LDLCALC", "CHOLHDL" in the last 168 hours.  Hematology Recent Labs  Lab 11/07/21 1025 11/11/21 0423  WBC 7.2 7.1  RBC 4.87 5.15*  HGB 14.5 15.1*  HCT 43.9 46.3*  MCV 90.1 89.9  MCH 29.8 29.3  MCHC 33.0 32.6  RDW 15.4 14.9  PLT 219 252   Thyroid  Recent Labs  Lab 11/09/21 0500  TSH 34.988*  FREET4 0.72    BNP Recent Labs  Lab   11/07/21 1025  BNP 237.2*    DDimer No results for input(s): "DDIMER" in the last 168 hours.   Radiology    DG CHEST PORT 1 VIEW  Result Date: 11/09/2021 CLINICAL DATA:  Hypertension EXAM: PORTABLE CHEST 1 VIEW COMPARISON:  11/07/2021 FINDINGS: Stable cardiomediastinal contours. Low lung volumes. Streaky bibasilar opacities, left greater than right, similar to prior. Overall decreased interstitial prominence bilaterally. No large pleural fluid collection. No pneumothorax. IMPRESSION: Persistent low lung volumes with streaky bibasilar opacities, left greater than right. Overall decreased interstitial prominence bilaterally, could reflect improving edema. Electronically Signed   By: Duanne Guess D.O.   On: 11/09/2021 12:25    Cardiac Studies   Echo 11/08/21 1. Left ventricular ejection fraction, by estimation, is 30 to 35%. The  left ventricle has moderately decreased function. The left ventricle  demonstrates global hypokinesis. The left ventricular internal cavity size  was moderately dilated. There is mild   left ventricular hypertrophy. Left ventricular diastolic parameters are  indeterminate.   2. Right ventricular systolic function is normal. The right ventricular  size is normal. There is normal  pulmonary artery systolic pressure.   3. Left atrial size was mildly dilated.   4. The mitral valve is normal in structure. Mild mitral valve  regurgitation. No evidence of mitral stenosis.   5. The aortic valve is normal in structure. Aortic valve regurgitation is  not visualized. No aortic stenosis is present.   6. The inferior vena cava is normal in size with greater than 50%  respiratory variability, suggesting right atrial pressure of 3 mmHg.   Patient Profile     66 y.o. female PMH hypertension, tobacco abuse presenting with dyspnea, orthopnea. Found to be in new afib and new systolic and diastolic heart failure  Assessment & Plan    New on set Afib - CHADSVASC at least 4, started on Eliquis this admission - Echo showed reduced LVEF  - rate control with metoprolol tartrate - plan for TEE/DCCV today  New Acute systolic and diastolic heart failure - patient presented with pulmonary edema, dyspnea and orthopnea found to have reduced EF and new afib - Echo showed LVEF 30-35%, global HK, mild LVH, normal RV pressure, mild MR - plan for TEE/DCCV for afib today - she will require ischemic work-up eventually - she is on lisinopril> plan for GDMT. BP can handle - Appears euvolemic on exam. Previously given IV lasix.  Subclinical hypothyroidism - TSH 34, FT4 wnl - per IM  For questions or updates, please contact [facility] Please consult [email or web address] for contact info under     Signed, Cadence David Stall, PA-C  11/11/2021, 7:51 AM    As above, patient seen and examined.  She denies dyspnea, chest pain or palpitations.  She remains in atrial fibrillation on telemetry.  Continue metoprolol (transition to Toprol 50 mg daily beginning tomorrow).  Continue apixaban.  Patient is for TEE guided cardioversion today.  LV function is reduced.  Question tachycardia mediated cardiomyopathy.  As above we will transition metoprolol to Toprol.  Discontinue lisinopril and instead treat with  losartan 25 mg daily.  Can transition to [facility] as an outpatient if blood pressure allows.  Would favor repeating echocardiogram 3 months after medications fully titrated and sinus reestablish.  If LV function remains decreased at that time would need ischemia evaluation. Olga Millers, MD

## 2021-11-11 NOTE — Progress Notes (Signed)
  Progress Note   Patient: Brandi Stanton ZYS:[id number] DOB: June 28, 1955 DOA: 11/07/2021     3 DOS: the patient was seen and examined on 11/11/2021        Brief hospital course: Brandi Stanton is a 65 y.o. F with HTN, smoking, obesity who presented with new onset Afib and CHF.        Assessment and Plan: * Acute on chronic systolic CHF (congestive heart failure) (HCC) I/Os not really recorded overnight.  Appears euvolemic on exam.  Cr and K stable, euvolemic off diuretic. - Hold diuretic - Strict I/Os, daily weights, daily BMP - Consult Cardiology, appreciate cares - Continue metoprolol, losartan   Atrial fibrillation with RVR (HCC) Went for DCCV today, didn't convert. - COntinue Eliquis, metoprolol - Start amio  Subclinical hypothyroidism TSH 34, fT4 normal.  Given Afib, will defer exogenous LT4. - Plan to recheck TSH as an outpatient  Uncontrolled hypertension SBP controlled - Continue metoprolol and losartan          Subjective: No complaints, she is feeling well, no swelling or dyspnea.     Physical Exam: Vitals:   11/11/21 0431 11/11/21 1240 11/11/21 1416 11/11/21 1426  BP:  (!) 129/100 (!) 114/93 (!) 128/98  Pulse:  93 (!) 112 (!) 104  Resp:  (!) 25 (!) 23 20  Temp:  98 F (36.7 C) (!) 97.3 F (36.3 C)   TempSrc:  Temporal Temporal   SpO2:  94% 93% 95%  Weight: 94.8 kg     Height:       Adult female, sitting up in bed, no acute distress , Normal rate, no murmurs, no peripheral edema Respiratory rate normal, lungs clear without rales or wheezes Abdomen soft without tenderness palpation or guarding, no ascites or distention Attention normal, affect normal, judgment and insight appear normal, face symmetric, speech fluent  Data Reviewed: Patient metabolic panel reviewed, potassium and magnesium normal      Disposition: Status is: Inpatient Patient presented with new A-fib and symptoms of congestive heart failure.  Cardiology consulted  and she underwent DCCV today, unfortunately did not convert and was started on amiodarone, if she tolerates this she will discharge tomorrow        Author: Alberteen Sam, MD 11/11/2021 2:35 PM  For on call review [email or web address].

## 2021-11-11 NOTE — CV Procedure (Signed)
TEE/DCC: Anesthesia: Propofol  Moderate LAE No LAA thrombus Mild/Mod MR Mild TR Normal AV EF 30-35%  No effusion Normal RV  DCC x 4 150 J x 1 then 200 J x 3 last two with manual compression  Patient did not convert  Discussed with Dr Jens Som start amiodarone continue Eliquis  Charlton Haws MD [facility]

## 2021-11-11 NOTE — Interval H&P Note (Signed)
History and Physical Interval Note:  11/11/2021 1:25 PM  Brandi Stanton  has presented today for surgery, with the diagnosis of atrial fibrillation.  The various methods of treatment have been discussed with the patient and family. After consideration of risks, benefits and other options for treatment, the patient has consented to  Procedure(s): TRANSESOPHAGEAL ECHOCARDIOGRAM (TEE) (N/A) CARDIOVERSION (N/A) as a surgical intervention.  The patient's history has been reviewed, patient examined, no change in status, stable for surgery.  I have reviewed the patient's chart and labs.  Questions were answered to the patient's satisfaction.     Charlton Haws

## 2021-11-11 NOTE — Anesthesia Preprocedure Evaluation (Addendum)
Anesthesia Evaluation  Patient identified by MRN, date of birth, ID band Patient awake    Reviewed: Allergy & Precautions, NPO status , Patient's Chart, lab work & pertinent test results  Airway Mallampati: III  TM Distance: >3 FB Neck ROM: Full    Dental  (+) Dental Advisory Given   Pulmonary Current Smoker and Patient abstained from smoking.,    Pulmonary exam normal breath sounds clear to auscultation       Cardiovascular hypertension, +CHF   Rhythm:Irregular Rate:Normal  Echo 11/08/2021 1. Left ventricular ejection fraction, by estimation, is 30 to 35%. The left ventricle has moderately decreased function. The left ventricle demonstrates global hypokinesis. The left ventricular internal cavity size was moderately dilated. There is mild left ventricular hypertrophy. Left ventricular diastolic parameters are indeterminate.  2. Right ventricular systolic function is normal. The right ventricular size is normal. There is normal pulmonary artery systolic pressure.  3. Left atrial size was mildly dilated.  4. The mitral valve is normal in structure. Mild mitral valve  regurgitation. No evidence of mitral stenosis.  5. The aortic valve is normal in structure. Aortic valve regurgitation is not visualized. No aortic stenosis is present.  6. The inferior vena cava is normal in size with greater than 50% respiratory variability, suggesting right atrial pressure of 3 mmHg.    Neuro/Psych negative neurological ROS     GI/Hepatic negative GI ROS, Neg liver ROS,   Endo/Other  Hypothyroidism   Renal/GU negative Renal ROS     Musculoskeletal negative musculoskeletal ROS (+)   Abdominal (+) + obese,   Peds  Hematology negative hematology ROS (+)   Anesthesia Other Findings   Reproductive/Obstetrics                            Anesthesia Physical Anesthesia Plan  ASA: 4  Anesthesia Plan: MAC    Post-op Pain Management: Minimal or no pain anticipated   Induction: Intravenous  PONV Risk Score and Plan: 1 and Propofol infusion, TIVA and Treatment may vary due to age or medical condition  Airway Management Planned: Natural Airway  Additional Equipment: None  Intra-op Plan:   Post-operative Plan:   Informed Consent: I have reviewed the patients History and Physical, chart, labs and discussed the procedure including the risks, benefits and alternatives for the proposed anesthesia with the patient or authorized representative who has indicated his/her understanding and acceptance.     Dental advisory given  Plan Discussed with: CRNA  Anesthesia Plan Comments:        Anesthesia Quick Evaluation

## 2021-11-11 NOTE — Anesthesia Procedure Notes (Signed)
Procedure Name: MAC Date/Time: 11/11/2021 1:52 PM  Performed by: Lowella Dell, CRNAPre-anesthesia Checklist: Patient identified, Emergency Drugs available, Suction available, Patient being monitored and Timeout performed Patient Re-evaluated:Patient Re-evaluated prior to induction Oxygen Delivery Method: Nasal cannula Placement Confirmation: positive ETCO2 Dental Injury: Teeth and Oropharynx as per pre-operative assessment

## 2021-11-11 NOTE — H&P (View-Only) (Signed)
Progress Note  Patient Name: Brandi Stanton Date of Encounter: 11/11/2021  [facility] Cardiologist: Chrystie Nose, MD   Subjective   Plan for TEE/DCCV today.  Patient denies dyspnea or chest pain  Inpatient Medications    Scheduled Meds:  apixaban  5 mg Oral BID   lisinopril  2.5 mg Oral Daily   metoprolol tartrate  25 mg Oral BID   Continuous Infusions:  PRN Meds: acetaminophen **OR** acetaminophen, ipratropium, levalbuterol, nicotine, ondansetron **OR** ondansetron (ZOFRAN) IV, mouth rinse   Vital Signs    Vitals:   11/10/21 1259 11/10/21 2137 11/11/21 0428 11/11/21 0431  BP: (!) 119/95 (!) 111/93 (!) 137/103   Pulse: 95 87 96   Resp: 18 20 20    Temp: (!) 97.4 F (36.3 C) 98.4 F (36.9 C) 98.1 F (36.7 C)   TempSrc:  Oral Oral   SpO2: 92% 95% 92%   Weight:    94.8 kg  Height:        Intake/Output Summary (Last 24 hours) at 11/11/2021 0751 Last data filed at 11/10/2021 1800 Gross per 24 hour  Intake 960 ml  Output --  Net 960 ml      11/11/2021    4:31 AM 11/10/2021    8:15 AM 11/10/2021    4:49 AM  Last 3 Weights  Weight (lbs) 208 lb 15.9 oz 209 lb 12.8 oz 207 lb 14.3 oz  Weight (kg) 94.8 kg 95.165 kg 94.3 kg      Telemetry    Afib Hr 80s - Personally Reviewed  ECG    Afib 91bpm, nonspecific T wave changes - Personally Reviewed  Physical Exam   GEN: No acute distress.   Neck: No JVD Cardiac: Irreg Irreg, no murmurs, rubs, or gallops.  Respiratory: Clear to auscultation bilaterally. GI: Soft, nontender, non-distended  MS: No edema; No deformity. Neuro:  Nonfocal  Psych: Normal affect   Labs    High Sensitivity Troponin:   Recent Labs  Lab 11/07/21 1050 11/07/21 1448  TROPONINIHS 3 3     Chemistry Recent Labs  Lab 11/08/21 0510 11/09/21 0500 11/10/21 0416 11/11/21 0423  NA 141 141 137 139  K 4.2 3.3* 3.9 3.8  CL 102 101 103 103  CO2 28 31 26 27   GLUCOSE 191* 144* 140* 146*  BUN 22 31* 28* 23  CREATININE 0.94 1.20*  0.93 1.09*  CALCIUM 8.6* 8.8* 8.3* 8.9  MG  --  2.0  --   --   PROT 7.2  --   --   --   ALBUMIN 3.7  --   --   --   AST 59*  --   --   --   ALT 88*  --   --   --   ALKPHOS 63  --   --   --   BILITOT 0.9  --   --   --   GFRNONAA >60 49* >60 55*  ANIONGAP 11 9 8 9     Lipids No results for input(s): "CHOL", "TRIG", "HDL", "LABVLDL", "LDLCALC", "CHOLHDL" in the last 168 hours.  Hematology Recent Labs  Lab 11/07/21 1025 11/11/21 0423  WBC 7.2 7.1  RBC 4.87 5.15*  HGB 14.5 15.1*  HCT 43.9 46.3*  MCV 90.1 89.9  MCH 29.8 29.3  MCHC 33.0 32.6  RDW 15.4 14.9  PLT 219 252   Thyroid  Recent Labs  Lab 11/09/21 0500  TSH 34.988*  FREET4 0.72    BNP Recent Labs  Lab  11/07/21 1025  BNP 237.2*    DDimer No results for input(s): "DDIMER" in the last 168 hours.   Radiology    DG CHEST PORT 1 VIEW  Result Date: 11/09/2021 CLINICAL DATA:  Hypertension EXAM: PORTABLE CHEST 1 VIEW COMPARISON:  11/07/2021 FINDINGS: Stable cardiomediastinal contours. Low lung volumes. Streaky bibasilar opacities, left greater than right, similar to prior. Overall decreased interstitial prominence bilaterally. No large pleural fluid collection. No pneumothorax. IMPRESSION: Persistent low lung volumes with streaky bibasilar opacities, left greater than right. Overall decreased interstitial prominence bilaterally, could reflect improving edema. Electronically Signed   By: Duanne Guess D.O.   On: 11/09/2021 12:25    Cardiac Studies   Echo 11/08/21 1. Left ventricular ejection fraction, by estimation, is 30 to 35%. The  left ventricle has moderately decreased function. The left ventricle  demonstrates global hypokinesis. The left ventricular internal cavity size  was moderately dilated. There is mild   left ventricular hypertrophy. Left ventricular diastolic parameters are  indeterminate.   2. Right ventricular systolic function is normal. The right ventricular  size is normal. There is normal  pulmonary artery systolic pressure.   3. Left atrial size was mildly dilated.   4. The mitral valve is normal in structure. Mild mitral valve  regurgitation. No evidence of mitral stenosis.   5. The aortic valve is normal in structure. Aortic valve regurgitation is  not visualized. No aortic stenosis is present.   6. The inferior vena cava is normal in size with greater than 50%  respiratory variability, suggesting right atrial pressure of 3 mmHg.   Patient Profile     66 y.o. female PMH hypertension, tobacco abuse presenting with dyspnea, orthopnea. Found to be in new afib and new systolic and diastolic heart failure  Assessment & Plan    New on set Afib - CHADSVASC at least 4, started on Eliquis this admission - Echo showed reduced LVEF  - rate control with metoprolol tartrate - plan for TEE/DCCV today  New Acute systolic and diastolic heart failure - patient presented with pulmonary edema, dyspnea and orthopnea found to have reduced EF and new afib - Echo showed LVEF 30-35%, global HK, mild LVH, normal RV pressure, mild MR - plan for TEE/DCCV for afib today - she will require ischemic work-up eventually - she is on lisinopril> plan for GDMT. BP can handle - Appears euvolemic on exam. Previously given IV lasix.  Subclinical hypothyroidism - TSH 34, FT4 wnl - per IM  For questions or updates, please contact [facility] Please consult [email or web address] for contact info under     Signed, Cadence David Stall, PA-C  11/11/2021, 7:51 AM    As above, patient seen and examined.  She denies dyspnea, chest pain or palpitations.  She remains in atrial fibrillation on telemetry.  Continue metoprolol (transition to Toprol 50 mg daily beginning tomorrow).  Continue apixaban.  Patient is for TEE guided cardioversion today.  LV function is reduced.  Question tachycardia mediated cardiomyopathy.  As above we will transition metoprolol to Toprol.  Discontinue lisinopril and instead treat with  losartan 25 mg daily.  Can transition to [facility] as an outpatient if blood pressure allows.  Would favor repeating echocardiogram 3 months after medications fully titrated and sinus reestablish.  If LV function remains decreased at that time would need ischemia evaluation. Olga Millers, MD

## 2021-11-12 ENCOUNTER — Other Ambulatory Visit (HOSPITAL_COMMUNITY): Payer: Self-pay

## 2021-11-12 ENCOUNTER — Telehealth: Payer: Self-pay | Admitting: *Deleted

## 2021-11-12 DIAGNOSIS — J9601 Acute respiratory failure with hypoxia: Secondary | ICD-10-CM | POA: Diagnosis not present

## 2021-11-12 DIAGNOSIS — E669 Obesity, unspecified: Secondary | ICD-10-CM | POA: Diagnosis not present

## 2021-11-12 DIAGNOSIS — I5023 Acute on chronic systolic (congestive) heart failure: Secondary | ICD-10-CM | POA: Diagnosis not present

## 2021-11-12 DIAGNOSIS — I4891 Unspecified atrial fibrillation: Secondary | ICD-10-CM | POA: Diagnosis not present

## 2021-11-12 LAB — BASIC METABOLIC PANEL
Anion gap: 8 (ref 5–15)
BUN: 20 mg/dL (ref 8–23)
CO2: 27 mmol/L (ref 22–32)
Calcium: 8.9 mg/dL (ref 8.9–10.3)
Chloride: 105 mmol/L (ref 98–111)
Creatinine, Ser: 1.11 mg/dL — ABNORMAL HIGH (ref 0.44–1.00)
GFR, Estimated: 54 mL/min — ABNORMAL LOW (ref >60)
Glucose, Bld: 145 mg/dL — ABNORMAL HIGH (ref 70–99)
Potassium: 4.2 mmol/L (ref 3.5–5.1)
Sodium: 140 mmol/L (ref 135–145)

## 2021-11-12 LAB — CBC
HCT: 45.5 % (ref 36.0–46.0)
Hemoglobin: 15 g/dL (ref 12.0–15.0)
MCH: 29.4 pg (ref 26.0–34.0)
MCHC: 33 g/dL (ref 30.0–36.0)
MCV: 89.2 fL (ref 80.0–100.0)
Platelets: 259 10*3/uL (ref 150–400)
RBC: 5.1 MIL/uL (ref 3.87–5.11)
RDW: 14.8 % (ref 11.5–15.5)
WBC: 7.6 10*3/uL (ref 4.0–10.5)
nRBC: 0 % (ref 0.0–0.2)

## 2021-11-12 MED ORDER — METOPROLOL SUCCINATE ER 50 MG PO TB24: 50.0000 mg | ORAL_TABLET | Freq: Every day | ORAL | 11 refills | Status: DC

## 2021-11-12 MED ORDER — LOSARTAN POTASSIUM 50 MG PO TABS: 50.0000 mg | ORAL_TABLET | Freq: Every day | ORAL | 11 refills | Status: DC

## 2021-11-12 MED ORDER — METOPROLOL SUCCINATE ER 50 MG PO TB24
50.0000 mg | ORAL_TABLET | Freq: Every day | ORAL | Status: DC
Start: 2021-11-12 — End: 2021-11-12
  Administered 2021-11-12: 50 mg via ORAL
  Filled 2021-11-12: qty 1

## 2021-11-12 MED ORDER — METOPROLOL SUCCINATE ER 50 MG PO TB24: 50.0000 mg | ORAL_TABLET | Freq: Every day | ORAL | 0 refills | Status: DC

## 2021-11-12 MED ORDER — LOSARTAN POTASSIUM 50 MG PO TABS: 50.0000 mg | ORAL_TABLET | Freq: Every day | ORAL | 0 refills | Status: DC

## 2021-11-12 MED ORDER — LOSARTAN POTASSIUM 50 MG PO TABS
50.0000 mg | ORAL_TABLET | Freq: Every day | ORAL | Status: DC
Start: 2021-11-12 — End: 2021-11-12
  Administered 2021-11-12: 50 mg via ORAL
  Filled 2021-11-12: qty 1

## 2021-11-12 MED ORDER — APIXABAN 5 MG PO TABS: 5.0000 mg | ORAL_TABLET | Freq: Two times a day (BID) | ORAL | 3 refills | Status: AC

## 2021-11-12 NOTE — Progress Notes (Signed)
Progress Note  Patient Name: Brandi Stanton Date of Encounter: 11/12/2021  [facility] Cardiologist: Pixie Casino, MD   Subjective   No CP or dyspnea  Inpatient Medications    Scheduled Meds:  apixaban  5 mg Oral BID   losartan  25 mg Oral Daily   metoprolol tartrate  25 mg Oral BID   Continuous Infusions:  amiodarone 30 mg/hr (11/12/21 0804)   PRN Meds: acetaminophen **OR** acetaminophen, ipratropium, levalbuterol, nicotine, ondansetron **OR** ondansetron (ZOFRAN) IV, mouth rinse   Vital Signs    Vitals:   11/11/21 2131 11/12/21 0005 11/12/21 0527 11/12/21 0600  BP: 126/90 100/68 135/83   Pulse: 91 68 81   Resp: 16 20 19    Temp: 98.2 F (36.8 C) 98.6 F (37 C) 98 F (36.7 C)   TempSrc: Oral Oral Oral   SpO2: 94% 93% 93%   Weight:   96.5 kg 95 kg  Height:        Intake/Output Summary (Last 24 hours) at 11/12/2021 0909 Last data filed at 11/12/2021 [id number] Gross per 24 hour  Intake 684.65 ml  Output --  Net 684.65 ml      11/12/2021    6:00 AM 11/12/2021    5:27 AM 11/11/2021    4:31 AM  Last 3 Weights  Weight (lbs) 209 lb 7 oz 212 lb 11.9 oz 208 lb 15.9 oz  Weight (kg) 95 kg 96.5 kg 94.8 kg      Telemetry    Atrial fibrillation rate controlled - Personally Reviewed   Physical Exam   GEN: No acute distress.   Neck: No JVD Cardiac: Irregular Respiratory: Clear to auscultation bilaterally. GI: Soft, nontender, non-distended  MS: No edema Neuro:  Nonfocal  Psych: Normal affect   Labs    High Sensitivity Troponin:   Recent Labs  Lab 11/07/21 1050 11/07/21 1448  TROPONINIHS 3 3     Chemistry Recent Labs  Lab 11/08/21 0510 11/09/21 0500 11/10/21 0416 11/11/21 0423 11/12/21 0517  NA 141 141 137 139 140  K 4.2 3.3* 3.9 3.8 4.2  CL 102 101 103 103 105  CO2 28 31 26 27 27   GLUCOSE 191* 144* 140* 146* 145*  BUN 22 31* 28* 23 20  CREATININE 0.94 1.20* 0.93 1.09* 1.11*  CALCIUM 8.6* 8.8* 8.3* 8.9 8.9  MG  --  2.0  --   --   --    PROT 7.2  --   --   --   --   ALBUMIN 3.7  --   --   --   --   AST 59*  --   --   --   --   ALT 88*  --   --   --   --   ALKPHOS 63  --   --   --   --   BILITOT 0.9  --   --   --   --   GFRNONAA >60 49* >60 55* 54*  ANIONGAP 11 9 8 9 8    Hematology Recent Labs  Lab 11/07/21 1025 11/11/21 0423 11/12/21 0517  WBC 7.2 7.1 7.6  RBC 4.87 5.15* 5.10  HGB 14.5 15.1* 15.0  HCT 43.9 46.3* 45.5  MCV 90.1 89.9 89.2  MCH 29.8 29.3 29.4  MCHC 33.0 32.6 33.0  RDW 15.4 14.9 14.8  PLT 219 252 259   Thyroid  Recent Labs  Lab 11/09/21 0500  TSH 34.988*  FREET4 0.72    BNP Recent Labs  Lab 11/07/21 1025  BNP 237.2*     Radiology    ECHO TEE  Result Date: 11/11/2021    TRANSESOPHOGEAL ECHO REPORT   Patient Name:   Brandi Stanton Date of Exam: 11/11/2021 Medical Rec #:  [id number]        Height:       67.0 in Accession #:    [id number]       Weight:       209.0 lb Date of Birth:  1956-04-20        BSA:          2.060 m Patient Age:    66 years         BP:           153/100 mmHg Patient Gender: F                HR:           110 bpm. Exam Location:  Inpatient Procedure: Transesophageal Echo and Color Doppler Indications:    ; I48.91* Unspeicified atrial fibrillation  History:        Patient has prior history of Echocardiogram examinations. CHF,                 Abnormal ECG, Arrythmias:Atrial Fibrillation; Risk                 Factors:Hypertension and Current Smoker.  Sonographer:    Lanae Boast Referring Phys: [id number] BRIDGETTE CHRISTOPHER PROCEDURE: After discussion of the risks and benefits of a TEE, an informed consent was obtained from the patient. The transesophogeal probe was passed without difficulty through the esophogus of the patient. Imaged were obtained with the patient in a left lateral decubitus position. Sedation performed by different physician. The patient was monitored while under deep sedation. Anesthestetic sedation was provided intravenously by Anesthesiology: 204mg  of  Propofol. The patient developed no complications during the procedure. An unsuccessful direct current cardioversion was performed at 200 joules with 3 attempts. IMPRESSIONS  1. Discussed with Dr Start iv amiodarone.  2. Since no LAA thrombus Proceeded with [facility] attempt x 3 150 J biphasic once then 200 J x 3 with manual compression Patient failed to convert.  3. Left ventricular ejection fraction, by estimation, is 30 to 35%. The left ventricle has moderately decreased function. The left ventricle demonstrates global hypokinesis. The left ventricular internal cavity size was mildly dilated. Left ventricular diastolic function could not be evaluated.  4. Right ventricular systolic function is normal. The right ventricular size is normal.  5. Left atrial size was moderately dilated. No left atrial/left atrial appendage thrombus was detected.  6. The mitral valve is abnormal. Mild to moderate mitral valve regurgitation. No evidence of mitral stenosis.  7. Tricuspid valve regurgitation is mild to moderate.  8. The aortic valve is tricuspid. Aortic valve regurgitation is not visualized. No aortic stenosis is present.  9. The inferior vena cava is normal in size with greater than 50% respiratory variability, suggesting right atrial pressure of 3 mmHg. Conclusion(s)/Recommendation(s): Normal biventricular function without evidence of hemodynamically significant valvular heart disease. FINDINGS  Left Ventricle: Left ventricular ejection fraction, by estimation, is 30 to 35%. The left ventricle has moderately decreased function. The left ventricle demonstrates global hypokinesis. The left ventricular internal cavity size was mildly dilated. There is no left ventricular hypertrophy. Left ventricular diastolic function could not be evaluated. Right Ventricle: The right ventricular size is normal. No increase in right ventricular wall thickness. Right ventricular systolic  function is normal. Left Atrium: Left atrial size  was moderately dilated. No left atrial/left atrial appendage thrombus was detected. Right Atrium: Right atrial size was normal in size. Pericardium: There is no evidence of pericardial effusion. Mitral Valve: The mitral valve is abnormal. Mild to moderate mitral valve regurgitation. No evidence of mitral valve stenosis. Tricuspid Valve: The tricuspid valve is normal in structure. Tricuspid valve regurgitation is mild to moderate. No evidence of tricuspid stenosis. Aortic Valve: The aortic valve is tricuspid. Aortic valve regurgitation is not visualized. No aortic stenosis is present. Pulmonic Valve: The pulmonic valve was normal in structure. Pulmonic valve regurgitation is not visualized. No evidence of pulmonic stenosis. Aorta: The aortic root is normal in size and structure. Venous: The inferior vena cava is normal in size with greater than 50% respiratory variability, suggesting right atrial pressure of 3 mmHg. IAS/Shunts: No atrial level shunt detected by color flow Doppler. Additional Comments: Since no LAA thrombus Proceeded with [facility] attempt x 3 150 J biphasic once then 200 J x 3 with manual compression Patient failed to convert. Discussed with Dr Jens Som Start iv amiodarone. Charlton Haws MD Electronically signed by Charlton Haws MD Signature Date/Time: 11/11/2021/2:28:31 PM    Final       Patient Profile     66 y.o. female PMH hypertension, tobacco abuse presenting with dyspnea, orthopnea. Found to be in new afib and new systolic and diastolic heart failure.  Echocardiogram showed ejection fraction 30 to 35%, moderate left ventricular enlargement, mild left ventricular hypertrophy, mild left atrial enlargement, mild mitral regurgitation.  Transesophageal echocardiogram showed ejection fraction 30 to 35%, no left atrial appendage thrombus, mild to moderate mitral regurgitation, mild to moderate tricuspid regurgitation.  Attempts at cardioversion unsuccessful.  Assessment & Plan    1 persistent atrial  fibrillation-patient had attempt at cardioversion yesterday.  However sinus could not be established.  Options at this point would be rate control/anticoagulation versus addition of antiarrhythmic with reattempt at cardioversion versus ablation.  Given elevated TSH I will discontinue amiodarone (would like to avoid long-term in this setting).  We will continue metoprolol and apixaban.  We will arrange follow-up in the atrial fibrillation clinic for further suggestions concerning above.  2 cardiomyopathy-etiology unclear.  Question tachycardia mediated related atrial fibrillation.  Change metoprolol to Toprol 50 mg daily.  Increase losartan to 50 mg daily.  Follow-up as an outpatient and if blood pressure/renal function stable transition to Entresto 24/26 twice daily.  Note she received her last dose of lisinopril yesterday.  Would favor repeating echocardiogram in 3 months to see if LV function has improved following rate control.  If not would need ischemia evaluation.  3 hypertension-blood pressure medications as outlined above.  We will titrate as needed.  4 tobacco abuse-patient counseled on discontinuing.  Patient can be discharged today.  Check potassium and renal function in 1 week.  We will arrange follow-up in atrial fibrillation clinic in 1 week.  Follow-up Dr. Rennis Golden in 3 months.  We will sign off.  Please call with questions.  For questions or updates, please contact [facility] Please consult [email or web address] for contact info under        Signed, Olga Millers, MD  11/12/2021, 9:09 AM

## 2021-11-12 NOTE — TOC Transition Note (Signed)
Transition of Care [facility]) - CM/SW Discharge Note   Patient Details  Name: Brandi Stanton MRN: [id number] Date of Birth: 1955/07/21  [facility]) CM/SW Contact:  Lanier Clam, RN Phone Number: 11/12/2021, 10:18 AM   Clinical Narrative: No orders or CM needs.      Final next level of care: Home/Self Care Barriers to Discharge: No Barriers Identified   Patient Goals and CMS Choice        Discharge Placement                       Discharge Plan and Services                                     Social Determinants of Health (SDOH) Interventions     Readmission Risk Interventions     No data to display

## 2021-11-12 NOTE — Telephone Encounter (Signed)
Patient has appt with Afib clinic 7/26-labs to be completed at appt.    Patient aware.

## 2021-11-12 NOTE — Telephone Encounter (Signed)
-----   Message from Freddi Starr, RN sent at 11/12/2021 11:25 AM EDT ----- Regarding: FW: hospital follow-up labs  ----- Message ----- From: Marianne Sofia, PA-C Sent: 11/12/2021  11:10 AM EDT To: Cv Div Nl Scheduling; Cv Div Nl Triage Subject: hospital follow-up labs                         Pt needs BMET in 1 week. Can we please call and arrange this? thanks

## 2021-11-12 NOTE — Discharge Summary (Signed)
Physician Discharge Summary   Patient: Brandi Stanton MRN: [id number] DOB: 1956/03/28  Admit date:     11/07/2021  Discharge date: 11/12/21  Discharge Physician: Edwin Dada   PCP: Pcp, No     Recommendations at discharge:  Follow up with Atrial Fibrillation clinic in 1 week AFib clinic: Please check K and Cr in 1 week AFib clinic: Please arrange for repeat TSH in 2-3 months Follow up with Dr. Debara Pickett in 3 months     Discharge Diagnoses: Principal Problem:   Acute on chronic systolic CHF (congestive heart failure) ([place]) Active Problems:   Atrial fibrillation with RVR ([place])   Uncontrolled hypertension   Class 1 obesity   Hypocalcemia   Acute respiratory failure with hypoxia ([place])   Tobacco use   Subclinical hypothyroidism   Hypokalemia      Hospital Course: Brandi Stanton is a 66 y.o. F with HTN, smoking, obesity who presented with several weeks of progressive palpitations, dyspnea on exertion, cough and orthopnea.  In the ER, CXR showed bilateral infiltrates and she was found to be in new onset Afib.        * Acute on chronic systolic CHF (congestive heart failure) ([place]) Admitted and started on rate control agents.  Echo 7/14 showed EF 30-35%.  She was diuresed and rates were controlled.  She was weaned off O2 and appeared euvolemic off diuretics.    DCCV was attempted but failed.  Given her sbuclinical hypothyroidism, amiodarone was deferred, rates were controlled with metoprolol and she was discharged on metoprolol and new Eliquis.    Close follow up arranged.   Paroxysmal atrial fibrillation Atrial fibrillation with RVR New onset paroxysmal atrial fibrillation.  CHA2DS2-Vasc 4.      Subclinical hypothyroidism TSH 34, fT4 normal.  Given Afib, exogenous LT4 deferred.  Recommend repeat TSH in 2-3 months.            The [facility] Controlled Substances Registry was reviewed for this patient prior to discharge.   Consultants:  Cardiology Procedures performed:  - Echocardiogram - TEE directed Cardioversion   Disposition: Home Diet recommendation:  Discharge Diet Orders (From admission, onward)     Start     Ordered   11/12/21 0000  Diet - low sodium heart healthy        11/12/21 1146             DISCHARGE MEDICATION: Allergies as of 11/12/2021       Reactions   Penicillins Anaphylaxis   Lidocaine Rash   Reaction to lidocaine patch        Medication List     STOP taking these medications    ibuprofen 200 MG tablet Commonly known as: ADVIL       TAKE these medications    apixaban 5 MG Tabs tablet Commonly known as: ELIQUIS Take 1 tablet (5 mg total) by mouth 2 (two) times daily.   BIOTIN PO Take 1 tablet by mouth every morning.   Fish Oil 1000 MG Caps Take 2,000 mg by mouth every morning.   FOLATE PO Take 4 tablets by mouth every morning.   losartan 50 MG tablet Commonly known as: Cozaar Take 1 tablet (50 mg total) by mouth daily.   losartan 50 MG tablet Commonly known as: COZAAR Take 1 tablet (50 mg total) by mouth daily. Start taking on: November 13, 2021   metoprolol succinate 50 MG 24 hr tablet Commonly known as: Toprol XL Take 1 tablet (50 mg total) by  mouth daily. Take with or immediately following a meal.   metoprolol succinate 50 MG 24 hr tablet Commonly known as: TOPROL-XL Take 1 tablet (50 mg total) by mouth daily. Take with or immediately following a meal. Start taking on: November 13, 2021   multivitamin with minerals Tabs tablet Take 1 tablet by mouth every morning.        Follow-up Information     [facility] Follow up on 11/20/2021.   Specialty: Cardiology Why: @ 2pm Contact information: [address] [id number] Wilhemina Bonito [place] [address] [phone]                Discharge Instructions     Amb referral to AFIB Clinic   Complete by: As directed    Diet - low sodium heart healthy   Complete  by: As directed    Discharge instructions   Complete by: As directed    From Dr. Maryfrances Bunnell: You were admitted for atrial fibrillation and heart failure  For the atrial fibrillation, you underwent a cardioversion procedure, to flip the heart back to a normal sinus rhythm, but this was unsuccessful. You should take the medicine metoprolol/Toprol XL 50 mg once daily to slow the heart rate Take the medicine apixaban/Eliquis 5 mg twice daily as a blood thinner Follow up with Rudi Coco at the Afib clinic in one week (see contact information and date and time in the To Do section below)   For the heart failure, you were treated with diuretics (Lasix, to remove fliud from the lungs) and this helped you come off oxygen You don't appear to need any more at this time Follow up with Dr. Rennis Golden from Cardiology after that For the heart failure, to protect the heart and control the blood pressure, take losartan 50 mg daily   Your new heart medicines are: Metoprolol Losartan Apixaban   Increase activity slowly   Complete by: As directed        Discharge Exam: Filed Weights   11/11/21 0431 11/12/21 0527 11/12/21 0600  Weight: 94.8 kg 96.5 kg 95 kg    General: Pt is alert, awake, not in acute distress Cardiovascular: RRR, nl S1-S2, no murmurs appreciated.   No LE edema.   Respiratory: Normal respiratory rate and rhythm.  CTAB without rales or wheezes. Abdominal: Abdomen soft and non-tender.  No distension or HSM.   Neuro/Psych: Strength symmetric in upper and lower extremities.  Judgment and insight appear normal.   Condition at discharge: good  The results of significant diagnostics from this hospitalization (including imaging, microbiology, ancillary and laboratory) are listed below for reference.   Imaging Studies: ECHO TEE  Result Date: 11/11/2021    TRANSESOPHOGEAL ECHO REPORT   Patient Name:   Brandi Stanton Date of Exam: 11/11/2021 Medical Rec #:  [id number]        Height:        67.0 in Accession #:    [id number]       Weight:       209.0 lb Date of Birth:  December 30, 1955        BSA:          2.060 m Patient Age:    66 years         BP:           153/100 mmHg Patient Gender: F                HR:  110 bpm. Exam Location:  Inpatient Procedure: Transesophageal Echo and Color Doppler Indications:    ; I48.91* Unspeicified atrial fibrillation  History:        Patient has prior history of Echocardiogram examinations. CHF,                 Abnormal ECG, Arrythmias:Atrial Fibrillation; Risk                 Factors:Hypertension and Current Smoker.  Sonographer:    Lanae Boast Referring Phys: [id number] BRIDGETTE Kelvin Sennett PROCEDURE: After discussion of the risks and benefits of a TEE, an informed consent was obtained from the patient. The transesophogeal probe was passed without difficulty through the esophogus of the patient. Imaged were obtained with the patient in a left lateral decubitus position. Sedation performed by different physician. The patient was monitored while under deep sedation. Anesthestetic sedation was provided intravenously by Anesthesiology: 204mg  of Propofol. The patient developed no complications during the procedure. An unsuccessful direct current cardioversion was performed at 200 joules with 3 attempts. IMPRESSIONS  1. Discussed with Dr Start iv amiodarone.  2. Since no LAA thrombus Proceeded with [facility] attempt x 3 150 J biphasic once then 200 J x 3 with manual compression Patient failed to convert.  3. Left ventricular ejection fraction, by estimation, is 30 to 35%. The left ventricle has moderately decreased function. The left ventricle demonstrates global hypokinesis. The left ventricular internal cavity size was mildly dilated. Left ventricular diastolic function could not be evaluated.  4. Right ventricular systolic function is normal. The right ventricular size is normal.  5. Left atrial size was moderately dilated. No left atrial/left atrial appendage  thrombus was detected.  6. The mitral valve is abnormal. Mild to moderate mitral valve regurgitation. No evidence of mitral stenosis.  7. Tricuspid valve regurgitation is mild to moderate.  8. The aortic valve is tricuspid. Aortic valve regurgitation is not visualized. No aortic stenosis is present.  9. The inferior vena cava is normal in size with greater than 50% respiratory variability, suggesting right atrial pressure of 3 mmHg. Conclusion(s)/Recommendation(s): Normal biventricular function without evidence of hemodynamically significant valvular heart disease. FINDINGS  Left Ventricle: Left ventricular ejection fraction, by estimation, is 30 to 35%. The left ventricle has moderately decreased function. The left ventricle demonstrates global hypokinesis. The left ventricular internal cavity size was mildly dilated. There is no left ventricular hypertrophy. Left ventricular diastolic function could not be evaluated. Right Ventricle: The right ventricular size is normal. No increase in right ventricular wall thickness. Right ventricular systolic function is normal. Left Atrium: Left atrial size was moderately dilated. No left atrial/left atrial appendage thrombus was detected. Right Atrium: Right atrial size was normal in size. Pericardium: There is no evidence of pericardial effusion. Mitral Valve: The mitral valve is abnormal. Mild to moderate mitral valve regurgitation. No evidence of mitral valve stenosis. Tricuspid Valve: The tricuspid valve is normal in structure. Tricuspid valve regurgitation is mild to moderate. No evidence of tricuspid stenosis. Aortic Valve: The aortic valve is tricuspid. Aortic valve regurgitation is not visualized. No aortic stenosis is present. Pulmonic Valve: The pulmonic valve was normal in structure. Pulmonic valve regurgitation is not visualized. No evidence of pulmonic stenosis. Aorta: The aortic root is normal in size and structure. Venous: The inferior vena cava is normal in  size with greater than 50% respiratory variability, suggesting right atrial pressure of 3 mmHg. IAS/Shunts: No atrial level shunt detected by color flow Doppler. Additional Comments: Since no LAA  thrombus Proceeded with [facility] attempt x 3 150 J biphasic once then 200 J x 3 with manual compression Patient failed to convert. Discussed with Dr Stanford Breed Start iv amiodarone. Jenkins Rouge MD Electronically signed by Jenkins Rouge MD Signature Date/Time: 11/11/2021/2:28:31 PM    Final    DG CHEST PORT 1 VIEW  Result Date: 11/09/2021 CLINICAL DATA:  Hypertension EXAM: PORTABLE CHEST 1 VIEW COMPARISON:  11/07/2021 FINDINGS: Stable cardiomediastinal contours. Low lung volumes. Streaky bibasilar opacities, left greater than right, similar to prior. Overall decreased interstitial prominence bilaterally. No large pleural fluid collection. No pneumothorax. IMPRESSION: Persistent low lung volumes with streaky bibasilar opacities, left greater than right. Overall decreased interstitial prominence bilaterally, could reflect improving edema. Electronically Signed   By: Davina Poke D.O.   On: 11/09/2021 12:25   ECHOCARDIOGRAM COMPLETE  Result Date: 11/08/2021    ECHOCARDIOGRAM REPORT   Patient Name:   Brandi Stanton Date of Exam: 11/08/2021 Medical Rec #:  [id number]        Height:       67.0 in Accession #:    [id number]       Weight:       202.6 lb Date of Birth:  03-03-1956        BSA:          2.033 m Patient Age:    82 years         BP:           121/85 mmHg Patient Gender: F                HR:           98 bpm. Exam Location:  Inpatient Procedure: 2D Echo, Cardiac Doppler and Color Doppler Indications:    Congestive Heart Failure I50.9                 Atrial Fibrillation I48.91  History:        Patient has no prior history of Echocardiogram examinations.                 Risk Factors:Hypertension.  Sonographer:    Bernadene Person RDCS Referring Phys: [id number] [place]  1. Left ventricular ejection  fraction, by estimation, is 30 to 35%. The left ventricle has moderately decreased function. The left ventricle demonstrates global hypokinesis. The left ventricular internal cavity size was moderately dilated. There is mild  left ventricular hypertrophy. Left ventricular diastolic parameters are indeterminate.  2. Right ventricular systolic function is normal. The right ventricular size is normal. There is normal pulmonary artery systolic pressure.  3. Left atrial size was mildly dilated.  4. The mitral valve is normal in structure. Mild mitral valve regurgitation. No evidence of mitral stenosis.  5. The aortic valve is normal in structure. Aortic valve regurgitation is not visualized. No aortic stenosis is present.  6. The inferior vena cava is normal in size with greater than 50% respiratory variability, suggesting right atrial pressure of 3 mmHg. FINDINGS  Left Ventricle: Left ventricular ejection fraction, by estimation, is 30 to 35%. The left ventricle has moderately decreased function. The left ventricle demonstrates global hypokinesis. The left ventricular internal cavity size was moderately dilated. There is mild left ventricular hypertrophy. Left ventricular diastolic parameters are indeterminate. Right Ventricle: The right ventricular size is normal. No increase in right ventricular wall thickness. Right ventricular systolic function is normal. There is normal pulmonary artery systolic pressure. The tricuspid regurgitant velocity is 2.38 m/s, and  with  an assumed right atrial pressure of 3 mmHg, the estimated right ventricular systolic pressure is 25.7 mmHg. Left Atrium: Left atrial size was mildly dilated. Right Atrium: Right atrial size was normal in size. Pericardium: There is no evidence of pericardial effusion. Mitral Valve: The mitral valve is normal in structure. Mild mitral valve regurgitation. No evidence of mitral valve stenosis. Tricuspid Valve: The tricuspid valve is normal in structure.  Tricuspid valve regurgitation is mild . No evidence of tricuspid stenosis. Aortic Valve: The aortic valve is normal in structure. Aortic valve regurgitation is not visualized. No aortic stenosis is present. Pulmonic Valve: The pulmonic valve was normal in structure. Pulmonic valve regurgitation is not visualized. No evidence of pulmonic stenosis. Aorta: The aortic root is normal in size and structure. Venous: The inferior vena cava is normal in size with greater than 50% respiratory variability, suggesting right atrial pressure of 3 mmHg. IAS/Shunts: No atrial level shunt detected by color flow Doppler.  LEFT VENTRICLE PLAX 2D LVIDd:         6.00 cm LVIDs:         5.10 cm LV PW:         1.30 cm LV IVS:        1.10 cm LVOT diam:     2.00 cm LV SV:         39 LV SV Index:   19 LVOT Area:     3.14 cm  LV Volumes (MOD) LV vol d, MOD A2C: 75.6 ml LV vol d, MOD A4C: 94.7 ml LV vol s, MOD A2C: 49.6 ml LV vol s, MOD A4C: 58.7 ml LV SV MOD A2C:     26.0 ml LV SV MOD A4C:     94.7 ml LV SV MOD BP:      30.3 ml RIGHT VENTRICLE RV S prime:     7.37 cm/s TAPSE (M-mode): 1.2 cm LEFT ATRIUM             Index        RIGHT ATRIUM           Index LA diam:        5.00 cm 2.46 cm/m   RA Area:     21.10 cm LA Vol (A2C):   54.3 ml 26.70 ml/m  RA Volume:   61.50 ml  30.25 ml/m LA Vol (A4C):   60.3 ml 29.65 ml/m LA Biplane Vol: 59.8 ml 29.41 ml/m  AORTIC VALVE LVOT Vmax:   80.07 cm/s LVOT Vmean:  51.300 cm/s LVOT VTI:    0.126 m  AORTA Ao Root diam: 3.10 cm Ao Asc diam:  3.80 cm MR Peak grad:    67.2 mmHg    TRICUSPID VALVE MR Mean grad:    48.0 mmHg    TR Peak grad:   22.7 mmHg MR Vmax:         410.00 cm/s  TR Vmax:        238.00 cm/s MR Vmean:        331.0 cm/s MR PISA:         1.01 cm     SHUNTS MR PISA Eff ROA: 9 mm        Systemic VTI:  0.13 m MR PISA Radius:  0.40 cm      Systemic Diam: 2.00 cm Donato Schultz MD Electronically signed by Donato Schultz MD Signature Date/Time: 11/08/2021/2:09:43 PM    Final    DG Chest 2  View  Result Date: 11/07/2021 CLINICAL DATA:  Shortness of breath EXAM: CHEST - 2 VIEW COMPARISON:  None Available. FINDINGS: Cardiac contours are upper limits of normal in size. Bilateral interstitial opacities. No focal consolidation. No large pleural effusion or pneumothorax. IMPRESSION: Bilateral interstitial opacities, likely due to pulmonary edema. Electronically Signed   By: Yetta Glassman M.D.   On: 11/07/2021 09:44    Microbiology: Results for orders placed or performed during the hospital encounter of 06/11/10  Gram stain     Status: None   Collection Time: 06/11/10 10:30 PM   Specimen: Wound  Result Value Ref Range Status   Specimen Description WOUND RIGHT FINGER CAT BITE  Final   Special Requests NONE  Final   Gram Stain   Final    FEW GRAM NEGATIVE RODS ABUNDANT WBC PRESENT, PREDOMINANTLY PMN CALLED [facility],C/TCU @0102  ON 06/12/10 BY KARCZEWSKI,S.   Report Status 06/12/2010 FINAL  Final  Wound culture     Status: None   Collection Time: 06/11/10 10:30 PM   Specimen: Wound  Result Value Ref Range Status   Specimen Description WOUND RIGHT FINGER CAT BITE  Final   Special Requests NONE  Final   Gram Stain   Final    ABUNDANT WBC PRESENT, PREDOMINANTLY PMN FEW GRAM NEGATIVE RODS CALLED [facility] C/TCU @0102  ON 06/12/10 BY KARCZEWSKI S. Performed by [facility]   Culture   Final    FEW PASTEURELLA SPECIES Note: Usually susceptible to penicillin and other beta lactam agents,quinolones,macrolides and tetracyclines.   Report Status 06/14/2010 FINAL  Final    Labs: CBC: Recent Labs  Lab 11/07/21 1025 11/11/21 0423 11/12/21 0517  WBC 7.2 7.1 7.6  NEUTROABS 4.6  --   --   HGB 14.5 15.1* 15.0  HCT 43.9 46.3* 45.5  MCV 90.1 89.9 89.2  PLT 219 252 [id number]   Basic Metabolic Panel: Recent Labs  Lab 11/08/21 0510 11/09/21 0500 11/10/21 0416 11/11/21 0423 11/12/21 0517  NA 141 141 137 139 140  K 4.2 3.3* 3.9 3.8 4.2  CL 102 101 103 103 105  CO2 28 31 26 27 27    GLUCOSE 191* 144* 140* 146* 145*  BUN 22 31* 28* 23 20  CREATININE 0.94 1.20* 0.93 1.09* 1.11*  CALCIUM 8.6* 8.8* 8.3* 8.9 8.9  MG  --  2.0  --   --   --    Liver Function Tests: Recent Labs  Lab 11/08/21 0510  AST 59*  ALT 88*  ALKPHOS 63  BILITOT 0.9  PROT 7.2  ALBUMIN 3.7   CBG: No results for input(s): "GLUCAP" in the last 168 hours.  Discharge time spent: approximately 25 minutes spent on discharge counseling, evaluation of patient on day of discharge, and coordination of discharge planning with nursing, social work, pharmacy and case management  Signed: Edwin Dada, MD [facility] 11/12/2021

## 2021-11-12 NOTE — Care Management Important Message (Signed)
Important Message  Patient Details IM Letter given to the Patient. Name: Brandi Stanton MRN: [id number] Date of Birth: 1955/09/26   Medicare Important Message Given:  Yes     Caren Macadam 11/12/2021, 10:44 AM

## 2021-11-12 NOTE — Progress Notes (Signed)
Patient to be discharged to home today. Patient given discharge instructions including all discharge Medications and schedules for these reviewed with the Patient. Understanding verbalized by the Patient. Discharge AVS with the Patient at time of discharge

## 2021-11-14 ENCOUNTER — Other Ambulatory Visit: Payer: Self-pay | Admitting: Medical

## 2021-11-14 DIAGNOSIS — I428 Other cardiomyopathies: Secondary | ICD-10-CM

## 2021-11-20 ENCOUNTER — Ambulatory Visit (HOSPITAL_COMMUNITY)
Admit: 2021-11-20 | Discharge: 2021-11-20 | Disposition: A | Discharge status: Home / Self Care | Payer: Medicare Other | Source: Ambulatory Visit | Attending: Nurse Practitioner | Admitting: Nurse Practitioner

## 2021-11-20 ENCOUNTER — Encounter (HOSPITAL_COMMUNITY): Payer: Self-pay | Admitting: Nurse Practitioner

## 2021-11-20 VITALS — BP 148/106 | HR 93 | Ht 67.0 in | Wt 211.6 lb

## 2021-11-20 DIAGNOSIS — R0601 Orthopnea: Secondary | ICD-10-CM | POA: Insufficient documentation

## 2021-11-20 DIAGNOSIS — R002 Palpitations: Secondary | ICD-10-CM | POA: Insufficient documentation

## 2021-11-20 DIAGNOSIS — F17210 Nicotine dependence, cigarettes, uncomplicated: Secondary | ICD-10-CM | POA: Diagnosis not present

## 2021-11-20 DIAGNOSIS — D6869 Other thrombophilia: Secondary | ICD-10-CM

## 2021-11-20 DIAGNOSIS — E039 Hypothyroidism, unspecified: Secondary | ICD-10-CM | POA: Insufficient documentation

## 2021-11-20 DIAGNOSIS — Z79899 Other long term (current) drug therapy: Secondary | ICD-10-CM | POA: Insufficient documentation

## 2021-11-20 DIAGNOSIS — I110 Hypertensive heart disease with heart failure: Secondary | ICD-10-CM | POA: Diagnosis not present

## 2021-11-20 DIAGNOSIS — R059 Cough, unspecified: Secondary | ICD-10-CM | POA: Diagnosis not present

## 2021-11-20 DIAGNOSIS — I4891 Unspecified atrial fibrillation: Secondary | ICD-10-CM

## 2021-11-20 DIAGNOSIS — R0609 Other forms of dyspnea: Secondary | ICD-10-CM | POA: Diagnosis not present

## 2021-11-20 DIAGNOSIS — Z7901 Long term (current) use of anticoagulants: Secondary | ICD-10-CM | POA: Diagnosis not present

## 2021-11-20 DIAGNOSIS — R946 Abnormal results of thyroid function studies: Secondary | ICD-10-CM | POA: Diagnosis not present

## 2021-11-20 DIAGNOSIS — R0602 Shortness of breath: Secondary | ICD-10-CM | POA: Insufficient documentation

## 2021-11-20 DIAGNOSIS — E669 Obesity, unspecified: Secondary | ICD-10-CM | POA: Diagnosis not present

## 2021-11-20 DIAGNOSIS — I509 Heart failure, unspecified: Secondary | ICD-10-CM | POA: Insufficient documentation

## 2021-11-20 LAB — BASIC METABOLIC PANEL
Anion gap: 8 (ref 5–15)
BUN: 18 mg/dL (ref 8–23)
CO2: 24 mmol/L (ref 22–32)
Calcium: 9.2 mg/dL (ref 8.9–10.3)
Chloride: 107 mmol/L (ref 98–111)
Creatinine, Ser: 1.12 mg/dL — ABNORMAL HIGH (ref 0.44–1.00)
GFR, Estimated: 54 mL/min — ABNORMAL LOW (ref >60)
Glucose, Bld: 102 mg/dL — ABNORMAL HIGH (ref 70–99)
Potassium: 4.3 mmol/L (ref 3.5–5.1)
Sodium: 139 mmol/L (ref 135–145)

## 2021-11-20 NOTE — Progress Notes (Signed)
Primary Care Physician: Pcp, No Referring Physician: [facility] f/u    Brandi Stanton is a 66 y.o. female with a h/o HTN, smoking, obesity who presented with several weeks of progressive palpitations, dyspnea on exertion, cough and orthopnea.   In the ER, CXR showed bilateral infiltrates and she was found to be in new onset Afib. She was admitted and started on rate control agents. Echo 7/14 showed EF 30-35%.  She was diuresed and rates were controlled.  She was weaned off O2 and appeared euvolemic off diuretics.  DCCV was attempted but failed.  Given her sbuclinical hypothyroidism, amiodarone was deferred, rates were controlled with metoprolol and she was discharged on metoprolol and new Eliquis.    She is now in the afib clinic for f/u. She states that she has now stopped smoking. She has not had a PCP and has not been to a MD in years. She feels good today. She remains in rate controlled afib. Duration of afib is unknown as she cant tell she is in it. Shortness of breath and  cough is what brought her to the hospital. These symptoms have resolved. She tried to get a f/u with a PCP but was told she would get a call when there was an appointment available. Her weight is staying normal. TSH in the hospital was 35. She is being compliant with eliquis 5 mg bid. On metoprolol for rate control.   Today, she denies symptoms of palpitations, chest pain, shortness of breath, orthopnea, PND, lower extremity edema, dizziness, presyncope, syncope, or neurologic sequela. The patient is tolerating medications without difficulties and is otherwise without complaint today.   Past Medical History:  Diagnosis Date   Hypertension    Past Surgical History:  Procedure Laterality Date   BREAST REDUCTION SURGERY     CARDIOVERSION N/A 11/11/2021   Procedure: CARDIOVERSION;  Surgeon: Wendall Stade, MD;  Location: [facility] ENDOSCOPY;  Service: Cardiovascular;  Laterality: N/A;   OTHER SURGICAL HISTORY     LEAP procedure    TEE WITHOUT CARDIOVERSION N/A 11/11/2021   Procedure: TRANSESOPHAGEAL ECHOCARDIOGRAM (TEE);  Surgeon: Wendall Stade, MD;  Location: [facility] ENDOSCOPY;  Service: Cardiovascular;  Laterality: N/A;    Current Outpatient Medications  Medication Sig Dispense Refill   apixaban (ELIQUIS) 5 MG TABS tablet Take 1 tablet (5 mg total) by mouth 2 (two) times daily. 60 tablet 3   BIOTIN PO Take 1 tablet by mouth every morning.     Folic Acid (FOLATE PO) Take 4 tablets by mouth every morning.     losartan (COZAAR) 50 MG tablet Take 1 tablet (50 mg total) by mouth daily. 30 tablet 11   metoprolol succinate (TOPROL XL) 50 MG 24 hr tablet Take 1 tablet (50 mg total) by mouth daily. Take with or immediately following a meal. 30 tablet 11   Multiple Vitamin (MULTIVITAMIN WITH MINERALS) TABS tablet Take 1 tablet by mouth every morning.     Omega-3 Fatty Acids (FISH OIL) 1000 MG CAPS Take 2,000 mg by mouth every morning.     No current facility-administered medications for this encounter.    Allergies  Allergen Reactions   Penicillins Anaphylaxis   Lidocaine Rash    Reaction to lidocaine patch    Social History   Socioeconomic History   Marital status: Widowed    Spouse name: Not on file   Number of children: Not on file   Years of education: Not on file   Highest education level: Not on file  Occupational History   Not on file  Tobacco Use   Smoking status: Every Day    Packs/day: 1.00    Types: Cigarettes   Smokeless tobacco: Not on file  Vaping Use   Vaping Use: Never used  Substance and Sexual Activity   Alcohol use: Yes   Drug use: Never   Sexual activity: Not on file  Other Topics Concern   Not on file  Social History Narrative   Not on file   Social Determinants of Health   Financial Resource Strain: Not on file  Food Insecurity: Not on file  Transportation Needs: Not on file  Physical Activity: Not on file  Stress: Not on file  Social Connections: Not on file  Intimate  Partner Violence: Not on file    Family History  Problem Relation Age of Onset   Diabetes Mother    Hypertension Mother    Rheumatic fever Father    Heart disease Father        Secondary to rheumatic fever.    ROS- All systems are reviewed and negative except as per the HPI above  Physical Exam: Vitals:   11/20/21 1337  BP: (!) 148/106  Pulse: 93  Weight: 96 kg  Height: 5\' 7"  (1.702 m)   Wt Readings from Last 3 Encounters:  11/20/21 96 kg  11/12/21 95 kg    Labs: Lab Results  Component Value Date   NA 140 11/12/2021   K 4.2 11/12/2021   CL 105 11/12/2021   CO2 27 11/12/2021   GLUCOSE 145 (H) 11/12/2021   BUN 20 11/12/2021   CREATININE 1.11 (H) 11/12/2021   CALCIUM 8.9 11/12/2021   MG 2.0 11/09/2021   No results found for: "INR" No results found for: "CHOL", "HDL", "LDLCALC", "TRIG"   GEN- The patient is well appearing, alert and oriented x 3 today.   Head- normocephalic, atraumatic Eyes-  Sclera clear, conjunctiva pink Ears- hearing intact Oropharynx- clear Neck- supple, no JVP Lymph- no cervical lymphadenopathy Lungs- Clear to ausculation bilaterally, normal work of breathing Heart- Regular rate and rhythm, no murmurs, rubs or gallops, PMI not laterally displaced GI- soft, NT, ND, + BS Extremities- no clubbing, cyanosis, or edema MS- no significant deformity or atrophy Skin- no rash or lesion Psych- euthymic mood, full affect Neuro- strength and sensation are intact  EKG-Vent. rate 93 BPM PR interval * ms QRS duration 70 ms QT/QTcB 326/405 ms P-R-T axes * 34 43 Atrial fibrillation Low voltage QRS Cannot rule out Anterior infarct , age undetermined Abnormal ECG When compared with ECG of 11-Nov-2021 14:23, PREVIOUS ECG IS PRESENT  Echo- 1. Left ventricular ejection fraction, by estimation, is 30 to 35%. The  left ventricle has moderately decreased function. The left ventricle  demonstrates global hypokinesis. The left ventricular internal  cavity size  was moderately dilated. There is mild   left ventricular hypertrophy. Left ventricular diastolic parameters are  indeterminate.   2. Right ventricular systolic function is normal. The right ventricular  size is normal. There is normal pulmonary artery systolic pressure.   3. Left atrial size was mildly dilated.   4. The mitral valve is normal in structure. Mild mitral valve  regurgitation. No evidence of mitral stenosis.   5. The aortic valve is normal in structure. Aortic valve regurgitation is  not visualized. No aortic stenosis is present.   6. The inferior vena cava is normal in size with greater than 50%  respiratory variability, suggesting right atrial pressure of 3 mmHg.  Assessment and Plan:  1. Afib Of unknown duration as pt can't  tell she is in afib and has not been  been to any doctor for years  She is rate controlled on metoprolol succinate 50 mg daily  General  eduction re afib and triggers  Congratulated on stopping smoking  Denies excessive caffeine, alcohol, snores at times, but no apnea In order to restore SR, with reduced EF, she will likely require Tikosyn, but do not want to attempt until thyroid can be evaluated and treated   2. Elevated TSH Pt will call again and ask for urgent PCP visit to establish as my attempts to restore SR may be blunted by abnormal thyroid   3. CHA2DS2VASc  score of of 3  Continue  eliquis 5 mg bid   4. Acute congested heart failure  Normovolemic Continue metoprolol Daily weights Limit salt   I will see back in  3 weeks to discuss restoring SR Hopefully she can get in to see a PCP prior to then   [place] [facility] [address] [place], [place] [address] [phone]

## 2021-12-11 ENCOUNTER — Encounter (HOSPITAL_COMMUNITY): Payer: Self-pay | Admitting: Nurse Practitioner

## 2021-12-11 ENCOUNTER — Ambulatory Visit (HOSPITAL_COMMUNITY)
Admission: RE | Admit: 2021-12-11 | Discharge: 2021-12-11 | Disposition: A | Discharge status: Home / Self Care | Payer: Medicare Other | Source: Ambulatory Visit | Attending: Nurse Practitioner | Admitting: Nurse Practitioner

## 2021-12-11 VITALS — BP 182/122 | HR 94 | Ht 67.0 in | Wt 216.6 lb

## 2021-12-11 DIAGNOSIS — I110 Hypertensive heart disease with heart failure: Secondary | ICD-10-CM | POA: Insufficient documentation

## 2021-12-11 DIAGNOSIS — Z7901 Long term (current) use of anticoagulants: Secondary | ICD-10-CM | POA: Insufficient documentation

## 2021-12-11 DIAGNOSIS — R946 Abnormal results of thyroid function studies: Secondary | ICD-10-CM | POA: Insufficient documentation

## 2021-12-11 DIAGNOSIS — I5023 Acute on chronic systolic (congestive) heart failure: Secondary | ICD-10-CM | POA: Diagnosis not present

## 2021-12-11 DIAGNOSIS — I4891 Unspecified atrial fibrillation: Secondary | ICD-10-CM | POA: Insufficient documentation

## 2021-12-11 DIAGNOSIS — Z6833 Body mass index (BMI) 33.0-33.9, adult: Secondary | ICD-10-CM | POA: Diagnosis not present

## 2021-12-11 DIAGNOSIS — D6869 Other thrombophilia: Secondary | ICD-10-CM | POA: Diagnosis not present

## 2021-12-11 DIAGNOSIS — E669 Obesity, unspecified: Secondary | ICD-10-CM | POA: Diagnosis not present

## 2021-12-11 DIAGNOSIS — I4819 Other persistent atrial fibrillation: Secondary | ICD-10-CM

## 2021-12-11 DIAGNOSIS — Z79899 Other long term (current) drug therapy: Secondary | ICD-10-CM | POA: Diagnosis not present

## 2021-12-11 DIAGNOSIS — F17210 Nicotine dependence, cigarettes, uncomplicated: Secondary | ICD-10-CM | POA: Insufficient documentation

## 2021-12-11 NOTE — Progress Notes (Signed)
Primary Care Physician: Pcp, No Referring Physician: [facility] f/u    Brandi Stanton is a 66 y.o. female with a h/o HTN, smoking, obesity who presented with several weeks of progressive palpitations, dyspnea on exertion, cough and orthopnea.   In the ER, CXR showed bilateral infiltrates and she was found to be in new onset Afib. She was admitted and started on rate control agents. Echo 7/14 showed EF 30-35%.  She was diuresed and rates were controlled.  She was weaned off O2 and appeared euvolemic off diuretics.  DCCV was attempted but failed.  Given her sbuclinical hypothyroidism, amiodarone was deferred, rates were controlled with metoprolol and she was discharged on metoprolol and new Eliquis.    She is now in the afib clinic for f/u. She states that she has now stopped smoking. She has not had a PCP and has not been to a MD in years. She feels good today. She remains in rate controlled afib. Duration of afib is unknown as she can't tell she is in it. Shortness of breath and  cough is what brought her to the hospital. These symptoms have resolved. She tried to get a f/u with a PCP but was told she would get a call when there was an appointment available. Her weight is staying normal. TSH in the hospital was 35. She is being compliant with eliquis 5 mg bid. On metoprolol for rate control.   F/u in afib clinic 8/16. She  did have f/u for her elevated TSH and thought to represent  subclinical hypothyroidism. She was given a rx for levothyroxine but ha s not started yet. She remains in rate controlled afib, duration  unknown. She feels ok in afib but  with reduced EF feel that SR needs to be restored. Amiodarone is being avoided 2/2 thyroid issues. Ablation cases are scheduled out to December. I discussed tikosyn with a possible later ablation down the road.    Today, she denies symptoms of palpitations, chest pain, shortness of breath, orthopnea, PND, lower extremity edema, dizziness, presyncope,  syncope, or neurologic sequela. The patient is tolerating medications without difficulties and is otherwise without complaint today.   Past Medical History:  Diagnosis Date   Hypertension    Past Surgical History:  Procedure Laterality Date   BREAST REDUCTION SURGERY     CARDIOVERSION N/A 11/11/2021   Procedure: CARDIOVERSION;  Surgeon: Wendall Stade, MD;  Location: [facility] ENDOSCOPY;  Service: Cardiovascular;  Laterality: N/A;   OTHER SURGICAL HISTORY     LEAP procedure   TEE WITHOUT CARDIOVERSION N/A 11/11/2021   Procedure: TRANSESOPHAGEAL ECHOCARDIOGRAM (TEE);  Surgeon: Wendall Stade, MD;  Location: [facility] ENDOSCOPY;  Service: Cardiovascular;  Laterality: N/A;    Current Outpatient Medications  Medication Sig Dispense Refill   apixaban (ELIQUIS) 5 MG TABS tablet Take 1 tablet (5 mg total) by mouth 2 (two) times daily. 60 tablet 3   BIOTIN PO Take 1 tablet by mouth every morning.     folic acid (FOLVITE) 400 MCG tablet Take by mouth.     levothyroxine (SYNTHROID) 100 MCG tablet Take 100 mcg by mouth every morning.     losartan (COZAAR) 100 MG tablet Take by mouth.     metoprolol succinate (TOPROL XL) 50 MG 24 hr tablet Take 1 tablet (50 mg total) by mouth daily. Take with or immediately following a meal. 30 tablet 11   Multiple Vitamin (MULTIVITAMIN WITH MINERALS) TABS tablet Take 1 tablet by mouth every morning.  nystatin cream (MYCOSTATIN) nystatin 100,000 unit/gram topical cream  Apply 1 application 3 times a day by topical route for 10 days.     Omega-3 Fatty Acids (FISH OIL) 1000 MG CAPS Take 2,000 mg by mouth every morning.     No current facility-administered medications for this encounter.    Allergies  Allergen Reactions   Penicillins Anaphylaxis   Lidocaine Rash    Reaction to lidocaine patch    Social History   Socioeconomic History   Marital status: Widowed    Spouse name: Not on file   Number of children: Not on file   Years of education: Not on file    Highest education level: Not on file  Occupational History   Not on file  Tobacco Use   Smoking status: Every Day    Packs/day: 1.00    Types: Cigarettes   Smokeless tobacco: Not on file  Vaping Use   Vaping Use: Never used  Substance and Sexual Activity   Alcohol use: Yes   Drug use: Never   Sexual activity: Not on file  Other Topics Concern   Not on file  Social History Narrative   Not on file   Social Determinants of Health   Financial Resource Strain: Not on file  Food Insecurity: Not on file  Transportation Needs: Not on file  Physical Activity: Not on file  Stress: Not on file  Social Connections: Not on file  Intimate Partner Violence: Not on file    Family History  Problem Relation Age of Onset   Diabetes Mother    Hypertension Mother    Rheumatic fever Father    Heart disease Father        Secondary to rheumatic fever.    ROS- All systems are reviewed and negative except as per the HPI above  Physical Exam: Vitals:   12/11/21 1317  Pulse: 94  Height: 5\' 7"  (1.702 m)   Wt Readings from Last 3 Encounters:  11/20/21 96 kg  11/12/21 95 kg    Labs: Lab Results  Component Value Date   NA 139 11/20/2021   K 4.3 11/20/2021   CL 107 11/20/2021   CO2 24 11/20/2021   GLUCOSE 102 (H) 11/20/2021   BUN 18 11/20/2021   CREATININE 1.12 (H) 11/20/2021   CALCIUM 9.2 11/20/2021   MG 2.0 11/09/2021   No results found for: "INR" No results found for: "CHOL", "HDL", "LDLCALC", "TRIG"   GEN- The patient is well appearing, alert and oriented x 3 today.   Head- normocephalic, atraumatic Eyes-  Sclera clear, conjunctiva pink Ears- hearing intact Oropharynx- clear Neck- supple, no JVP Lymph- no cervical lymphadenopathy Lungs- Clear to ausculation bilaterally, normal work of breathing Heart- Regular rate and rhythm, no murmurs, rubs or gallops, PMI not laterally displaced GI- soft, NT, ND, + BS Extremities- no clubbing, cyanosis, or edema MS- no  significant deformity or atrophy Skin- no rash or lesion Psych- euthymic mood, full affect Neuro- strength and sensation are intact  EKG-Vent. rate 94 BPM PR interval * ms QRS duration 68 ms QT/QTcB 344/430 ms P-R-T axes * 43 29 Atrial fibrillation Low voltage QRS Cannot rule out Anterior infarct , age undetermined Abnormal ECG When compared with ECG of 20-Nov-2021 14:00, PREVIOUS ECG IS PRESENT  Echo- 1. Left ventricular ejection fraction, by estimation, is 30 to 35%. The  left ventricle has moderately decreased function. The left ventricle  demonstrates global hypokinesis. The left ventricular internal cavity size  was moderately dilated. There  is mild   left ventricular hypertrophy. Left ventricular diastolic parameters are  indeterminate.   2. Right ventricular systolic function is normal. The right ventricular  size is normal. There is normal pulmonary artery systolic pressure.   3. Left atrial size was mildly dilated.   4. The mitral valve is normal in structure. Mild mitral valve  regurgitation. No evidence of mitral stenosis.   5. The aortic valve is normal in structure. Aortic valve regurgitation is  not visualized. No aortic stenosis is present.   6. The inferior vena cava is normal in size with greater than 50%  respiratory variability, suggesting right atrial pressure of 3 mmHg.     Assessment and Plan:  1. Afib Of unknown duration as pt can't  tell she is in afib and has not been  been to any doctor for years  She is rate controlled on metoprolol succinate 50 mg daily  General  eduction re afib and triggers  Congratulated on stopping smoking  Denies excessive caffeine, alcohol, snores at times, but no apnea In order to restore SR, with reduced EF, she will likely require Tikosyn  I discussed today and tikosyn admit ingo sheet given.  She will consider possible admission dates and call us back to schedule Qtc looks ok, cost of drug discussed and no offending  drugs.will send to  PharmD when she commits to hospitalization   2. Elevated TSH  Pfp thought subclinical as T4 was not elevated She has been rx'ed on levothyroxine but has not started, encouraged for her to get on drug   3. CHA2DS2VASc  score of of 3  Continue  eliquis 5 mg bid  No missed doses Reminded not to miss any doses  4. Acute congested heart failure  Normovolemic Continue metoprolol Daily weights Limit salt   Pending pt to get back to [place] re Tikosyn admit dates  Elvina Sidle. [facility] [address] [place], [place] [address] [phone]

## 2021-12-12 ENCOUNTER — Telehealth (HOSPITAL_COMMUNITY): Payer: Self-pay

## 2021-12-12 ENCOUNTER — Other Ambulatory Visit (HOSPITAL_COMMUNITY): Payer: Self-pay

## 2021-12-12 NOTE — Telephone Encounter (Signed)
Pharmacy Transitions of Care Follow-up Telephone Call  Date of discharge: 11/12/2021  Discharge Diagnosis: Atrial Fibrillation   How have you been since you were released from the hospital? Patient has been doing well since discharge. She has no questions or concerns regarding her medications; however, she would like her apixaban prescription to be changed from 30ds to 90ds. A voicemail was left at provider's office.    Medication changes made at discharge: START taking: apixaban (ELIQUIS)  metoprolol succinate (Toprol XL)    STOP taking: ibuprofen 200 MG tablet (ADVIL)   Medication changes verified by the patient? Yes     Medication Accessibility:  Home Pharmacy: [facility]  Was the patient provided with refills on discharged medications? No   Have all prescriptions been transferred from [facility] to home pharmacy? Yes   Is the patient interested in using a [place] pharmacy? No  Is the patient able to afford medications? Yes  Referred patient to patient care advocate for medication assistance? No  Medication Review:  APIXABAN (ELIQUIS)  Apixaban 5 mg BID  - Discussed importance of taking medication around the same time every day.  - Advised patient of medications to avoid (NSAIDs, ASA maintenance doses>100 mg daily)  - Educated that Tylenol (acetaminophen) will be the preferred analgesic to prevent risk of bleeding. - Emphasized importance of monitoring for signs and symptoms of bleeding (abnormal bruising, prolonged bleeding, nose bleeds, bleeding from gums, discolored urine, black tarry stools)  - Advised patient to alert all providers of anticoagulation therapy prior to starting a new medication or having a procedure.  Follow-up Appointments:  [facility]? Saw Rudi Coco, NP on 11/20/2021   If their condition worsens, is the pt aware to call PCP or go to the Emergency Dept.? Yes  Final Patient Assessment: Patient has had  Cardiology follow-up and has refills sent to home pharmacy.

## 2021-12-23 ENCOUNTER — Telehealth: Payer: Self-pay

## 2021-12-23 NOTE — Telephone Encounter (Signed)
Medication list reviewed in anticipation of upcoming Tikosyn initiation. Patient is not taking any contraindicated or QTc prolonging medications.   Patient is anticoagulated on Eliquis on the appropriate dose. Please ensure that patient has not missed any anticoagulation doses in the 3 weeks prior to Tikosyn initiation.   Patient will need to be counseled to avoid use of Benadryl while on Tikosyn and in the 2-3 days prior to Tikosyn initiation.  

## 2022-01-10 ENCOUNTER — Encounter (HOSPITAL_COMMUNITY): Payer: Self-pay

## 2022-01-14 ENCOUNTER — Other Ambulatory Visit (HOSPITAL_COMMUNITY): Payer: Self-pay

## 2022-01-14 ENCOUNTER — Other Ambulatory Visit: Payer: Self-pay

## 2022-01-14 ENCOUNTER — Inpatient Hospital Stay (HOSPITAL_COMMUNITY)
Admission: AD | Admit: 2022-01-14 | Discharge: 2022-01-17 | DRG: 309 | Disposition: A | Discharge status: Home / Self Care | Payer: Medicare Other | Source: Ambulatory Visit | Attending: Cardiology | Admitting: Cardiology

## 2022-01-14 ENCOUNTER — Ambulatory Visit (HOSPITAL_COMMUNITY)
Admission: RE | Admit: 2022-01-14 | Discharge: 2022-01-14 | Disposition: A | Discharge status: Home / Self Care | Payer: Medicare Other | Source: Ambulatory Visit | Attending: Nurse Practitioner | Admitting: Nurse Practitioner

## 2022-01-14 ENCOUNTER — Encounter (HOSPITAL_COMMUNITY): Payer: Self-pay | Admitting: Cardiology

## 2022-01-14 VITALS — BP 152/86 | HR 108 | Ht 67.0 in | Wt 215.6 lb

## 2022-01-14 DIAGNOSIS — I4819 Other persistent atrial fibrillation: Secondary | ICD-10-CM | POA: Diagnosis present

## 2022-01-14 DIAGNOSIS — F17210 Nicotine dependence, cigarettes, uncomplicated: Secondary | ICD-10-CM | POA: Diagnosis present

## 2022-01-14 DIAGNOSIS — I4891 Unspecified atrial fibrillation: Principal | ICD-10-CM | POA: Diagnosis present

## 2022-01-14 DIAGNOSIS — Z6833 Body mass index (BMI) 33.0-33.9, adult: Secondary | ICD-10-CM | POA: Diagnosis not present

## 2022-01-14 DIAGNOSIS — Z880 Allergy status to penicillin: Secondary | ICD-10-CM

## 2022-01-14 DIAGNOSIS — Z833 Family history of diabetes mellitus: Secondary | ICD-10-CM | POA: Diagnosis not present

## 2022-01-14 DIAGNOSIS — E669 Obesity, unspecified: Secondary | ICD-10-CM | POA: Diagnosis present

## 2022-01-14 DIAGNOSIS — Z7901 Long term (current) use of anticoagulants: Secondary | ICD-10-CM

## 2022-01-14 DIAGNOSIS — Z8249 Family history of ischemic heart disease and other diseases of the circulatory system: Secondary | ICD-10-CM

## 2022-01-14 DIAGNOSIS — I472 Ventricular tachycardia, unspecified: Secondary | ICD-10-CM | POA: Diagnosis present

## 2022-01-14 DIAGNOSIS — Z79899 Other long term (current) drug therapy: Secondary | ICD-10-CM | POA: Diagnosis not present

## 2022-01-14 DIAGNOSIS — E039 Hypothyroidism, unspecified: Secondary | ICD-10-CM | POA: Diagnosis not present

## 2022-01-14 DIAGNOSIS — E038 Other specified hypothyroidism: Secondary | ICD-10-CM | POA: Diagnosis present

## 2022-01-14 DIAGNOSIS — I110 Hypertensive heart disease with heart failure: Secondary | ICD-10-CM | POA: Diagnosis present

## 2022-01-14 DIAGNOSIS — Z79890 Hormone replacement therapy (postmenopausal): Secondary | ICD-10-CM

## 2022-01-14 DIAGNOSIS — D6869 Other thrombophilia: Secondary | ICD-10-CM

## 2022-01-14 DIAGNOSIS — Z888 Allergy status to other drugs, medicaments and biological substances status: Secondary | ICD-10-CM

## 2022-01-14 DIAGNOSIS — I5022 Chronic systolic (congestive) heart failure: Secondary | ICD-10-CM | POA: Diagnosis present

## 2022-01-14 DIAGNOSIS — I509 Heart failure, unspecified: Secondary | ICD-10-CM | POA: Diagnosis not present

## 2022-01-14 HISTORY — DX: Unspecified atrial fibrillation: I48.91

## 2022-01-14 HISTORY — DX: Bronchitis, not specified as acute or chronic: J40

## 2022-01-14 LAB — BASIC METABOLIC PANEL
Anion gap: 7 (ref 5–15)
BUN: 18 mg/dL (ref 8–23)
CO2: 27 mmol/L (ref 22–32)
Calcium: 9.3 mg/dL (ref 8.9–10.3)
Chloride: 105 mmol/L (ref 98–111)
Creatinine, Ser: 0.94 mg/dL (ref 0.44–1.00)
GFR, Estimated: >60 mL/min (ref >60)
Glucose, Bld: 98 mg/dL (ref 70–99)
Potassium: 5.1 mmol/L (ref 3.5–5.1)
Sodium: 139 mmol/L (ref 135–145)

## 2022-01-14 LAB — MAGNESIUM: Magnesium: 2.1 mg/dL (ref 1.7–2.4)

## 2022-01-14 MED ORDER — APIXABAN 5 MG PO TABS: 5.0000 mg | ORAL_TABLET | Freq: Two times a day (BID) | ORAL | Status: DC

## 2022-01-14 MED ORDER — SODIUM CHLORIDE 0.9% FLUSH
3.0000 mL | Freq: Two times a day (BID) | INTRAVENOUS | Status: DC
  Administered 2022-01-14 – 2022-01-17 (×6): 3 mL via INTRAVENOUS

## 2022-01-14 MED ORDER — METOPROLOL SUCCINATE ER 50 MG PO TB24
50.0000 mg | ORAL_TABLET | Freq: Every day | ORAL | Status: DC
  Administered 2022-01-15 – 2022-01-17 (×3): 50 mg via ORAL
  Filled 2022-01-14 (×3): qty 1

## 2022-01-14 MED ORDER — DOFETILIDE 500 MCG PO CAPS
500.0000 ug | ORAL_CAPSULE | Freq: Two times a day (BID) | ORAL | Status: DC
  Administered 2022-01-14 – 2022-01-17 (×6): 500 ug via ORAL
  Filled 2022-01-14 (×6): qty 1

## 2022-01-14 MED ORDER — APIXABAN 5 MG PO TABS
5.0000 mg | ORAL_TABLET | Freq: Two times a day (BID) | ORAL | Status: DC
  Administered 2022-01-14 – 2022-01-17 (×6): 5 mg via ORAL
  Filled 2022-01-14 (×6): qty 1

## 2022-01-14 MED ORDER — LEVOTHYROXINE SODIUM 100 MCG PO TABS
100.0000 ug | ORAL_TABLET | Freq: Every morning | ORAL | Status: DC
  Administered 2022-01-15 – 2022-01-17 (×3): 100 ug via ORAL
  Filled 2022-01-14 (×3): qty 1

## 2022-01-14 MED ORDER — LOSARTAN POTASSIUM 50 MG PO TABS
100.0000 mg | ORAL_TABLET | Freq: Every day | ORAL | Status: DC
  Administered 2022-01-15 – 2022-01-17 (×3): 100 mg via ORAL
  Filled 2022-01-14 (×3): qty 2

## 2022-01-14 MED ORDER — SODIUM CHLORIDE 0.9 % IV SOLN
250.0000 mL | INTRAVENOUS | Status: DC | PRN
  Administered 2022-01-15: 250 mL via INTRAVENOUS

## 2022-01-14 MED ORDER — SODIUM CHLORIDE 0.9% FLUSH: 3.0000 mL | INTRAVENOUS | Status: DC | PRN

## 2022-01-14 NOTE — Care Management (Signed)
  Transition of Care [facility] ) Screening Note   Patient Details  Name: Brandi Stanton Date of Birth: May 09, 1955   Transition of Care [facility]) CM/SW Contact:    Bethena Roys, RN Phone Number: 01/14/2022, 12:29 PM    Transition of Care Department [facility]) has reviewed the patient. Patient presented for Tikosyn Load. Benefits check has been submitted for cost. Case Manager will follow for cost and pharmacy of choice as the patient progresses.

## 2022-01-14 NOTE — Progress Notes (Signed)
Primary Care Physician: Pcp, No Referring Physician: [facility] f/u    Brandi Stanton is a 66 y.o. female with a h/o HTN, smoking, obesity who presented to ED 11/07/21 with several weeks of progressive palpitations, dyspnea on exertion, cough and orthopnea.   In the ER, CXR showed bilateral infiltrates and she was found to be in new onset Afib. She was admitted and started on rate control agents. Echo 7/14 showed EF 30-35%.  She was diuresed and rates were controlled.  She was weaned off O2 and appeared euvolemic off diuretics.  DCCV was attempted but failed.  Given her sbuclinical hypothyroidism, amiodarone was deferred, rates were controlled with metoprolol and she was discharged on metoprolol and  Eliquis.    She is now in the afib clinic for f/u. She states that she has now stopped smoking. She has not had a PCP and has not been to a MD in years. She feels good today. She remains in rate controlled afib. Duration of afib is unknown as she can't tell she is in it. Shortness of breath and  cough is what brought her to the hospital. These symptoms have resolved. She tried to get a f/u with a PCP but was told she would get a call when there was an appointment available. Her weight is staying normal. TSH in the hospital was 35. She is being compliant with eliquis 5 mg bid. On metoprolol for rate control.   F/u in afib clinic 8/16. She  did have f/u for her elevated TSH and thought to represent  subclinical hypothyroidism. She was given a rx for levothyroxine but had not started yet. She remains in rate controlled afib, duration  unknown. She feels ok in afib but  with reduced EF feel that SR needs to be restored. Amiodarone is being avoided 2/2 thyroid issues. Ablation cases are scheduled out to December. I discussed tikosyn with a possible later ablation down the road.  She was in agreement.   F/u afib clinic 01/13/22. She is here for tikosyn admit. Qtc at 418 ms, no benadryl use. No missed apixaban for at  least 3 weeks. General  info re tikosyn admit. She remains in afib at 108 bpm.   Today, she denies symptoms of palpitations, chest pain, shortness of breath, orthopnea, PND, lower extremity edema, dizziness, presyncope, syncope, or neurologic sequela. The patient is tolerating medications without difficulties and is otherwise without complaint today.   Past Medical History:  Diagnosis Date   Hypertension    Past Surgical History:  Procedure Laterality Date   BREAST REDUCTION SURGERY     CARDIOVERSION N/A 11/11/2021   Procedure: CARDIOVERSION;  Surgeon: Wendall Stade, MD;  Location: [facility] ENDOSCOPY;  Service: Cardiovascular;  Laterality: N/A;   OTHER SURGICAL HISTORY     LEAP procedure   TEE WITHOUT CARDIOVERSION N/A 11/11/2021   Procedure: TRANSESOPHAGEAL ECHOCARDIOGRAM (TEE);  Surgeon: Wendall Stade, MD;  Location: [facility] ENDOSCOPY;  Service: Cardiovascular;  Laterality: N/A;    Current Outpatient Medications  Medication Sig Dispense Refill   apixaban (ELIQUIS) 5 MG TABS tablet Take 1 tablet (5 mg total) by mouth 2 (two) times daily. 60 tablet 3   BIOTIN PO Take 1 tablet by mouth every morning.     FOLIC ACID PO Take 4 tablets by mouth daily.     levothyroxine (SYNTHROID) 100 MCG tablet Take 100 mcg by mouth every morning.     losartan (COZAAR) 100 MG tablet Take 100 mg by mouth daily.  metoprolol succinate (TOPROL XL) 50 MG 24 hr tablet Take 1 tablet (50 mg total) by mouth daily. Take with or immediately following a meal. 30 tablet 11   Multiple Vitamin (MULTIVITAMIN WITH MINERALS) TABS tablet Take 1 tablet by mouth every morning.     nystatin cream (MYCOSTATIN) Apply 1 Application topically as needed.     Omega-3 Fatty Acids (FISH OIL) 1000 MG CAPS Take 2,000 mg by mouth every morning.     No current facility-administered medications for this encounter.    Allergies  Allergen Reactions   Penicillins Anaphylaxis   Lidocaine Rash    Reaction to lidocaine patch    Social  History   Socioeconomic History   Marital status: Widowed    Spouse name: Not on file   Number of children: Not on file   Years of education: Not on file   Highest education level: Not on file  Occupational History   Not on file  Tobacco Use   Smoking status: Every Day    Packs/day: 1.00    Types: Cigarettes   Smokeless tobacco: Not on file  Vaping Use   Vaping Use: Never used  Substance and Sexual Activity   Alcohol use: Yes   Drug use: Never   Sexual activity: Not on file  Other Topics Concern   Not on file  Social History Narrative   Not on file   Social Determinants of Health   Financial Resource Strain: Not on file  Food Insecurity: Not on file  Transportation Needs: Not on file  Physical Activity: Not on file  Stress: Not on file  Social Connections: Not on file  Intimate Partner Violence: Not on file    Family History  Problem Relation Age of Onset   Diabetes Mother    Hypertension Mother    Rheumatic fever Father    Heart disease Father        Secondary to rheumatic fever.    ROS- All systems are reviewed and negative except as per the HPI above  Physical Exam: Vitals:   01/14/22 1019  BP: (!) 152/86  Pulse: (!) 108  Weight: 97.8 kg  Height: 5\' 7"  (1.702 m)   Wt Readings from Last 3 Encounters:  01/14/22 97.8 kg  12/11/21 98.2 kg  11/20/21 96 kg    Labs: Lab Results  Component Value Date   NA 139 11/20/2021   K 4.3 11/20/2021   CL 107 11/20/2021   CO2 24 11/20/2021   GLUCOSE 102 (H) 11/20/2021   BUN 18 11/20/2021   CREATININE 1.12 (H) 11/20/2021   CALCIUM 9.2 11/20/2021   MG 2.0 11/09/2021   No results found for: "INR" No results found for: "CHOL", "HDL", "LDLCALC", "TRIG"   GEN- The patient is well appearing, alert and oriented x 3 today.   Head- normocephalic, atraumatic Eyes-  Sclera clear, conjunctiva pink Ears- hearing intact Oropharynx- clear Neck- supple, no JVP Lymph- no cervical lymphadenopathy Lungs- Clear to  ausculation bilaterally, normal work of breathing Heart- irregular rate and rhythm, no murmurs, rubs or gallops, PMI not laterally displaced GI- soft, NT, ND, + BS Extremities- no clubbing, cyanosis, or edema MS- no significant deformity or atrophy Skin- no rash or lesion Psych- euthymic mood, full affect Neuro- strength and sensation are intact  EKG- Vent. rate 108 BPM PR interval * ms QRS duration 68 ms QT/QTcB 312/418 ms P-R-T axes * 24 65 Atrial fibrillation with rapid ventricular response with premature ventricular or aberrantly conducted complexes Low voltage QRS  Cannot rule out Anterior infarct , age undetermined Abnormal ECG When compared with ECG of 11-Dec-2021 13:27, PREVIOUS ECG IS PRESENT  Echo- 1. Left ventricular ejection fraction, by estimation, is 30 to 35%. The  left ventricle has moderately decreased function. The left ventricle  demonstrates global hypokinesis. The left ventricular internal cavity size  was moderately dilated. There is mild   left ventricular hypertrophy. Left ventricular diastolic parameters are  indeterminate.   2. Right ventricular systolic function is normal. The right ventricular  size is normal. There is normal pulmonary artery systolic pressure.   3. Left atrial size was mildly dilated.   4. The mitral valve is normal in structure. Mild mitral valve  regurgitation. No evidence of mitral stenosis.   5. The aortic valve is normal in structure. Aortic valve regurgitation is  not visualized. No aortic stenosis is present.   6. The inferior vena cava is normal in size with greater than 50%  respiratory variability, suggesting right atrial pressure of 3 mmHg.     Assessment and Plan:  1. Afib Of unknown duration as pt can't  tell she is in afib and has not been  been to any doctor for years  She is reasonably  rate controlled on metoprolol succinate 50 mg daily  Denies excessive caffeine, alcohol, snores at times, but no apnea In order  to restore SR, with reduced EF, she is here now for   Tikosyn admit  No benadryl use I reviewed hospital routine for Tikosyn today  Qtc looks ok, cost of drug discussed and no offending drug per PharmD   2. Elevated TSH Pcp  thought subclinical as T4 was not elevated She has been started on levothyroxine   3. CHA2DS2VASc  score of of 3  Continue  eliquis 5 mg bid  No missed doses  4. Acute congested heart failure  Normovolemic Continue metoprolol Daily weights Limit salt   To 6E6  Richelle Glick C. Shantel Helwig, [facility] [address] [place], [place] [address] [phone]

## 2022-01-14 NOTE — H&P (Signed)
Electrophysiology H&P  Note   Primary Care Physician: Pcp, No Referring Physician: [facility] f/u    Brandi Stanton is a 66 y.o. female with a h/o HTN, smoking, obesity who presented to ED 11/07/21 with several weeks of progressive palpitations, dyspnea on exertion, cough and orthopnea.   In the ER, CXR showed bilateral infiltrates and she was found to be in new onset Afib. She was admitted and started on rate control agents. Echo 7/14 showed EF 30-35%.  She was diuresed and rates were controlled.  She was weaned off O2 and appeared euvolemic off diuretics.  DCCV was attempted but failed.  Given her sbuclinical hypothyroidism, amiodarone was deferred, rates were controlled with metoprolol and she was discharged on metoprolol and  Eliquis.    She is now in the afib clinic for f/u. She states that she has now stopped smoking. She has not had a PCP and has not been to a MD in years. She feels good today. She remains in rate controlled afib. Duration of afib is unknown as she can't tell she is in it. Shortness of breath and  cough is what brought her to the hospital. These symptoms have resolved. She tried to get a f/u with a PCP but was told she would get a call when there was an appointment available. Her weight is staying normal. TSH in the hospital was 35. She is being compliant with eliquis 5 mg bid. On metoprolol for rate control.   F/u in afib clinic 8/16. She  did have f/u for her elevated TSH and thought to represent  subclinical hypothyroidism. She was given a rx for levothyroxine but had not started yet. She remains in rate controlled afib, duration  unknown. She feels ok in afib but  with reduced EF feel that SR needs to be restored. Amiodarone is being avoided 2/2 thyroid issues. Ablation cases are scheduled out to December. I discussed tikosyn with a possible later ablation down the road.  She was in agreement.   F/u afib clinic 01/13/22. She is here for tikosyn admit. Qtc at 418 ms, no benadryl  use. No missed apixaban for at least 3 weeks. General  info re tikosyn admit. She remains in afib at 108 bpm.   Today, she denies symptoms of palpitations, chest pain, shortness of breath, orthopnea, PND, lower extremity edema, dizziness, presyncope, syncope, or neurologic sequela. The patient is tolerating medications without difficulties and is otherwise without complaint today.   Past Medical History:  Diagnosis Date   Hypertension    Past Surgical History:  Procedure Laterality Date   BREAST REDUCTION SURGERY     CARDIOVERSION N/A 11/11/2021   Procedure: CARDIOVERSION;  Surgeon: Wendall Stade, MD;  Location: [facility] ENDOSCOPY;  Service: Cardiovascular;  Laterality: N/A;   OTHER SURGICAL HISTORY     LEAP procedure   TEE WITHOUT CARDIOVERSION N/A 11/11/2021   Procedure: TRANSESOPHAGEAL ECHOCARDIOGRAM (TEE);  Surgeon: Wendall Stade, MD;  Location: [facility] ENDOSCOPY;  Service: Cardiovascular;  Laterality: N/A;    Current Facility-Administered Medications  Medication Dose Route Frequency Provider Last Rate Last Admin   0.9 %  sodium chloride infusion  250 mL Intravenous PRN Newman Nip, NP       apixaban Everlene Balls) tablet 5 mg  5 mg Oral BID Newman Nip, NP       dofetilide (TIKOSYN) capsule 500 mcg  500 mcg Oral BID Newman Nip, NP       [START ON 01/15/2022] levothyroxine (SYNTHROID) tablet 100  mcg  100 mcg Oral q morning Sherran Needs, NP       [START ON 01/15/2022] losartan (COZAAR) tablet 100 mg  100 mg Oral Q breakfast Sherran Needs, NP       [START ON 01/15/2022] metoprolol succinate (TOPROL-XL) 24 hr tablet 50 mg  50 mg Oral Q breakfast Sherran Needs, NP       sodium chloride flush (NS) 0.9 % injection 3 mL  3 mL Intravenous Q12H Sherran Needs, NP       sodium chloride flush (NS) 0.9 % injection 3 mL  3 mL Intravenous PRN Sherran Needs, NP        Allergies  Allergen Reactions   Penicillins Anaphylaxis   Lidocaine Rash    Reaction to lidocaine patch     Social History   Socioeconomic History   Marital status: Widowed    Spouse name: Not on file   Number of children: Not on file   Years of education: Not on file   Highest education level: Not on file  Occupational History   Not on file  Tobacco Use   Smoking status: Every Day    Packs/day: 1.00    Types: Cigarettes   Smokeless tobacco: Not on file  Vaping Use   Vaping Use: Never used  Substance and Sexual Activity   Alcohol use: Yes   Drug use: Never   Sexual activity: Not on file  Other Topics Concern   Not on file  Social History Narrative   Not on file   Social Determinants of Health   Financial Resource Strain: Not on file  Food Insecurity: Not on file  Transportation Needs: Not on file  Physical Activity: Not on file  Stress: Not on file  Social Connections: Not on file  Intimate Partner Violence: Not on file    Family History  Problem Relation Age of Onset   Diabetes Mother    Hypertension Mother    Rheumatic fever Father    Heart disease Father        Secondary to rheumatic fever.    ROS- All systems are reviewed and negative except as per the HPI above  Physical Exam: Vitals:   01/14/22 1223  BP: (!) 148/83  Pulse: 97  Resp: 18  Temp: 98.5 F (36.9 C)  TempSrc: Oral  Weight: 97.4 kg  Height: 5\' 7"  (1.702 m)   Wt Readings from Last 3 Encounters:  01/14/22 97.4 kg  01/14/22 97.8 kg  12/11/21 98.2 kg    Labs: Lab Results  Component Value Date   NA 139 01/14/2022   K 5.1 01/14/2022   CL 105 01/14/2022   CO2 27 01/14/2022   GLUCOSE 98 01/14/2022   BUN 18 01/14/2022   CREATININE 0.94 01/14/2022   CALCIUM 9.3 01/14/2022   MG 2.1 01/14/2022   No results found for: "INR" No results found for: "CHOL", "HDL", "Momence", "TRIG"   GEN- The patient is well appearing, alert and oriented x 3 today.   Head- normocephalic, atraumatic Eyes-  Sclera clear, conjunctiva pink Ears- hearing intact Oropharynx- clear Neck- supple, no  JVP Lymph- no cervical lymphadenopathy Lungs- Clear to ausculation bilaterally, normal work of breathing Heart- irregular rate and rhythm, no murmurs, rubs or gallops, PMI not laterally displaced GI- soft, NT, ND, + BS Extremities- no clubbing, cyanosis, or edema MS- no significant deformity or atrophy Skin- no rash or lesion Psych- euthymic mood, full affect Neuro- strength and sensation are intact  EKG-  Vent. rate 108 BPM PR interval * ms QRS duration 68 ms QT/QTcB 312/418 ms P-R-T axes * 24 65 Atrial fibrillation with rapid ventricular response with premature ventricular or aberrantly conducted complexes Low voltage QRS Cannot rule out Anterior infarct , age undetermined Abnormal ECG When compared with ECG of 11-Dec-2021 13:27, PREVIOUS ECG IS PRESENT  Echo- 1. Left ventricular ejection fraction, by estimation, is 30 to 35%. The  left ventricle has moderately decreased function. The left ventricle  demonstrates global hypokinesis. The left ventricular internal cavity size  was moderately dilated. There is mild   left ventricular hypertrophy. Left ventricular diastolic parameters are  indeterminate.   2. Right ventricular systolic function is normal. The right ventricular  size is normal. There is normal pulmonary artery systolic pressure.   3. Left atrial size was mildly dilated.   4. The mitral valve is normal in structure. Mild mitral valve  regurgitation. No evidence of mitral stenosis.   5. The aortic valve is normal in structure. Aortic valve regurgitation is  not visualized. No aortic stenosis is present.   6. The inferior vena cava is normal in size with greater than 50%  respiratory variability, suggesting right atrial pressure of 3 mmHg.     Assessment and Plan:  1. Afib Of unknown duration as pt can't  tell she is in afib and has not been  been to any doctor for years  She is reasonably  rate controlled on metoprolol succinate 50 mg daily  Denies excessive  caffeine, alcohol, snores at times, but no apnea In order to restore SR, with reduced EF, she is here now for   Tikosyn admit  No benadryl use I reviewed hospital routine for Tikosyn today  Qtc looks ok, cost of drug discussed and no offending drug per PharmD   2. Elevated TSH Pcp  thought subclinical as T4 was not elevated She has been started on levothyroxine   3. CHA2DS2VASc  score of of 3  Continue  eliquis 5 mg bid  No missed doses  4.Chronic systolic heart failure  Echo 10/2021 LVEF 30-35% Normovolemic Continue metoprolol Daily weights Limit salt   Pt presents to 6E06 for tikosyn admission as above.   Casimiro Needle [address]" [place], PA-C  01/14/2022 1:44 PM

## 2022-01-14 NOTE — Progress Notes (Signed)
Pharmacy: Dofetilide (Tikosyn) - Initial Consult Assessment and Electrolyte Replacement  Pharmacy consulted to assist in monitoring and replacing electrolytes in this 66 y.o. female admitted on 01/14/2022 undergoing dofetilide initiation.   Assessment:  Patient Exclusion Criteria: If any screening criteria checked as "Yes", then  patient  should NOT receive dofetilide until criteria item is corrected.  If "Yes" please indicate correction plan.  YES  NO Patient  Exclusion Criteria Correction Plan   []   [x]   Baseline QTc interval is greater than or equal to 440 msec. IF above YES box checked dofetilide contraindicated unless patient has ICD; then may proceed if QTc 500-550 msec or with known ventricular conduction abnormalities may proceed with QTc 550-600 msec. QTc = 418    []   [x]   Patient is known or suspected to have a digoxin level greater than 2 ng/ml: No results found for: "DIGOXIN"     []   [x]   Creatinine clearance less than 20 ml/min (calculated using Cockcroft-Gault, actual body weight and serum creatinine): Estimated Creatinine Clearance: 68.6 mL/min (by C-G formula based on SCr of 0.94 mg/dL).     []   [x]  Patient has received drugs known to prolong the QT intervals within the last 48 hours (phenothiazines, tricyclics or tetracyclic antidepressants, erythromycin, H-1 antihistamines, cisapride, fluoroquinolones, azithromycin, ondansetron).   Updated information on QT prolonging agents is available to be searched on the following database:QT prolonging agents     []   [x]   Patient received a dose of hydrochlorothiazide (Oretic) alone or in any combination including triamterene (Dyazide, Maxzide) in the last 48 hours.    []   [x]  Patient received a medication known to increase dofetilide plasma concentrations prior to initial dofetilide dose:  Trimethoprim (Primsol, Proloprim) in the last 36 hours Verapamil (Calan, Verelan) in the last 36 hours or a sustained release dose  in the last 72 hours Megestrol (Megace) in the last 5 days  Cimetidine (Tagamet) in the last 6 hours Ketoconazole (Nizoral) in the last 24 hours Itraconazole (Sporanox) in the last 48 hours  Prochlorperazine (Compazine) in the last 36 hours     []   [x]   Patient is known to have a history of torsades de pointes; congenital or acquired long QT syndromes.    []   [x]   Patient has received a Class 1 antiarrhythmic with less than 2 half-lives since last dose. (Disopyramide, Quinidine, Procainamide, Lidocaine, Mexiletine, Flecainide, Propafenone)    []   [x]   Patient has received amiodarone therapy in the past 3 months or amiodarone level is greater than 0.3 ng/ml.    Labs:    Component Value Date/Time   K 5.1 01/14/2022 1020   MG 2.1 01/14/2022 1020     Plan: [facility] Calculated CrCl  Dose q12h  [x]  > 60 ml/min 500 mcg  []  40-60 ml/min 250 mcg  []  20-40 ml/min 125 mcg   [x]   Physician selected initial dose within range recommended for patients level of renal function - will monitor for response.  []   Physician selected initial dose outside of range recommended for patients level of renal function - will discuss if the dose should be altered at this time.   Patient has been appropriately anticoagulated with apixaban.  Potassium: K >/= 4: Appropriate to initiate Tikosyn, no replacement needed    Magnesium: Mg >2: Appropriate to initiate Tikosyn, no replacement needed     Thank you for allowing pharmacy to participate in this patient's care   Hildred Laser, PharmD Clinical Pharmacist **Pharmacist phone directory can now  be found on [email or web address] ([email or web address]).  Listed under [place].

## 2022-01-15 DIAGNOSIS — I4819 Other persistent atrial fibrillation: Secondary | ICD-10-CM | POA: Diagnosis not present

## 2022-01-15 LAB — BASIC METABOLIC PANEL
Anion gap: 11 (ref 5–15)
BUN: 17 mg/dL (ref 8–23)
CO2: 26 mmol/L (ref 22–32)
Calcium: 9.4 mg/dL (ref 8.9–10.3)
Chloride: 104 mmol/L (ref 98–111)
Creatinine, Ser: 1.02 mg/dL — ABNORMAL HIGH (ref 0.44–1.00)
GFR, Estimated: 60 mL/min — ABNORMAL LOW (ref >60)
Glucose, Bld: 126 mg/dL — ABNORMAL HIGH (ref 70–99)
Potassium: 4.5 mmol/L (ref 3.5–5.1)
Sodium: 141 mmol/L (ref 135–145)

## 2022-01-15 LAB — PROTIME-INR
INR: 1.2 (ref 0.8–1.2)
Prothrombin Time: 14.8 seconds (ref 11.4–15.2)

## 2022-01-15 LAB — MAGNESIUM: Magnesium: 1.9 mg/dL (ref 1.7–2.4)

## 2022-01-15 MED ORDER — SODIUM CHLORIDE 0.9 % IV SOLN: INTRAVENOUS | Status: DC

## 2022-01-15 MED ORDER — MAGNESIUM SULFATE 2 GM/50ML IV SOLN
2.0000 g | Freq: Once | INTRAVENOUS | Status: AC
  Administered 2022-01-15: 2 g via INTRAVENOUS
  Filled 2022-01-15: qty 50

## 2022-01-15 NOTE — Progress Notes (Signed)
Pharmacy: Dofetilide (Tikosyn) - Follow Up Assessment and Electrolyte Replacement  Pharmacy consulted to assist in monitoring and replacing electrolytes in this 66 y.o. female admitted on 01/14/2022 undergoing dofetilide initiation.  Labs:    Component Value Date/Time   K 4.5 01/15/2022 0349   MG 1.9 01/15/2022 0349     Plan: Potassium: K >/= 4: No additional supplementation needed  Magnesium: Mg 1.8-2: Give Mg 2 gm IV x1 - ordered per MD    Thank you for allowing pharmacy to participate in this patient's care   Hildred Laser, PharmD Clinical Pharmacist **Pharmacist phone directory can now be found on [email or web address] ([email or web address]).  Listed under [place].

## 2022-01-15 NOTE — Progress Notes (Signed)
Mobility Specialist - Progress Note   01/15/22 1100  Mobility  Activity Ambulated independently in hallway  Level of Assistance Independent  Assistive Device None  Distance Ambulated (ft) 440 ft  Activity Response Tolerated well  $Mobility charge 1 Mobility    During mobility:118 HR Post-mobility:98 HR  Pt was received in bed and agreeable. No complaints throughout. Pt was left in room with all needs met.  Larey Seat

## 2022-01-15 NOTE — Plan of Care (Signed)

## 2022-01-15 NOTE — Progress Notes (Signed)
Electrophysiology Rounding Note  Patient Name: Brandi Stanton Date of Encounter: 01/15/2022  Primary Cardiologist: Chrystie Nose, MD  Electrophysiologist: None    Subjective   Pt remains in afib on Tikosyn 500 mcg BID   QTc from EKG last pm shows stable QTc at ~470-480  The patient is doing well today.  At this time, the patient denies chest pain, shortness of breath, or any new concerns.  Inpatient Medications    Scheduled Meds:  apixaban  5 mg Oral BID   dofetilide  500 mcg Oral BID   levothyroxine  100 mcg Oral q morning   losartan  100 mg Oral Q breakfast   metoprolol succinate  50 mg Oral Q breakfast   sodium chloride flush  3 mL Intravenous Q12H   Continuous Infusions:  sodium chloride     PRN Meds: sodium chloride, sodium chloride flush   Vital Signs    Vitals:   01/14/22 1223 01/14/22 1938 01/15/22 0352 01/15/22 0800  BP: (!) 148/83 130/76 114/84   Pulse: 97 90 97 94  Resp: 18 16 18 16   Temp: 98.5 F (36.9 C) 98.5 F (36.9 C) 98.4 F (36.9 C)   TempSrc: Oral Oral Oral   SpO2:  98%  98%  Weight: 97.4 kg     Height: 5\' 7"  (1.702 m)       Intake/Output Summary (Last 24 hours) at 01/15/2022 0826 Last data filed at 01/14/2022 2015 Gross per 24 hour  Intake 240 ml  Output --  Net 240 ml   Filed Weights   01/14/22 1223  Weight: 97.4 kg    Physical Exam    GEN- The patient is well appearing, alert and oriented x 3 today.   Head- normocephalic, atraumatic Eyes-  Sclera clear, conjunctiva pink Ears- hearing intact Oropharynx- clear Neck- supple Lungs- Clear to ausculation bilaterally, normal work of breathing Heart- Irregularly irregular rate and rhythm, no murmurs, rubs or gallops GI- soft, NT, ND, + BS Extremities- no clubbing, cyanosis, or edema Skin- no rash or lesion Psych- euthymic mood, full affect Neuro- strength and sensation are intact  Labs    CBC No results for input(s): "WBC", "NEUTROABS", "HGB", "HCT", "MCV", "PLT" in  the last 72 hours. Basic Metabolic Panel Recent Labs    01/16/2022 1020 01/15/22 0349  NA 139 141  K 5.1 4.5  CL 105 104  CO2 27 26  GLUCOSE 98 126*  BUN 18 17  CREATININE 0.94 1.02*  CALCIUM 9.3 9.4  MG 2.1 1.9    Potassium  Date/Time Value Ref Range Status  01/15/2022 03:49 AM 4.5 3.5 - 5.1 mmol/L Final   Magnesium  Date/Time Value Ref Range Status  01/15/2022 03:49 AM 1.9 1.7 - 2.4 mg/dL Final    Comment:    Performed at [facility], [address], [place], [address] [place]    Telemetry    AF 90-120s (personally reviewed)  Radiology    No results found.   Patient Profile     Brandi Stanton is a 66 y.o. female with a past medical history significant for persistent atrial fibrillation.  They were admitted for tikosyn load.   Assessment & Plan    Persistent atrial fibrillation Pt remains in afib on Tikosyn 500 mcg BID  Continue Eliquis Creatinine, ser  1.02* (09/20 0349) Magnesium  1.9 (09/20 0349) Potassium4.5 (09/20 0349) Supplement Mg  If pt does not convert chemically, plan on DCCV tomorrow    For questions or  updates, please contact [place] Please consult [email or web address] for contact info under Cardiology/STEMI.  Signed, Shirley Friar, PA-C  01/15/2022, 8:26 AM

## 2022-01-15 NOTE — Progress Notes (Signed)
Morning EKG reviewed     Shows pt remains in afib at with borderline QTc at ~480-490 ms. Will continue current dose and assess QT once in sinus.   Continue  Tikosyn 500 mcg BID.   Potassium4.5 (09/20 0349) Magnesium  1.9 (09/20 0349) Creatinine, ser  1.02* (09/20 0349)  Pt will be NPO after midnight for DCCV if remains in [place], [place]  Pager: [phone]  01/15/2022 11:09 AM

## 2022-01-16 ENCOUNTER — Inpatient Hospital Stay (HOSPITAL_COMMUNITY): Payer: Medicare Other | Admitting: Anesthesiology

## 2022-01-16 ENCOUNTER — Encounter (HOSPITAL_COMMUNITY): Payer: Self-pay | Admitting: Cardiology

## 2022-01-16 ENCOUNTER — Encounter (HOSPITAL_COMMUNITY)
Admission: AD | Disposition: A | Discharge status: Home / Self Care | Payer: Self-pay | Source: Ambulatory Visit | Attending: Cardiology

## 2022-01-16 DIAGNOSIS — I509 Heart failure, unspecified: Secondary | ICD-10-CM | POA: Diagnosis not present

## 2022-01-16 DIAGNOSIS — E039 Hypothyroidism, unspecified: Secondary | ICD-10-CM

## 2022-01-16 DIAGNOSIS — I110 Hypertensive heart disease with heart failure: Secondary | ICD-10-CM | POA: Diagnosis not present

## 2022-01-16 DIAGNOSIS — I4891 Unspecified atrial fibrillation: Secondary | ICD-10-CM

## 2022-01-16 DIAGNOSIS — I4819 Other persistent atrial fibrillation: Secondary | ICD-10-CM | POA: Diagnosis not present

## 2022-01-16 HISTORY — PX: CARDIOVERSION: SHX1299

## 2022-01-16 LAB — BASIC METABOLIC PANEL
Anion gap: 7 (ref 5–15)
BUN: 19 mg/dL (ref 8–23)
CO2: 26 mmol/L (ref 22–32)
Calcium: 8.6 mg/dL — ABNORMAL LOW (ref 8.9–10.3)
Chloride: 104 mmol/L (ref 98–111)
Creatinine, Ser: 0.88 mg/dL (ref 0.44–1.00)
GFR, Estimated: >60 mL/min (ref >60)
Glucose, Bld: 133 mg/dL — ABNORMAL HIGH (ref 70–99)
Potassium: 4.1 mmol/L (ref 3.5–5.1)
Sodium: 137 mmol/L (ref 135–145)

## 2022-01-16 LAB — MAGNESIUM: Magnesium: 2 mg/dL (ref 1.7–2.4)

## 2022-01-16 SURGERY — CARDIOVERSION
Anesthesia: General

## 2022-01-16 MED ORDER — ACETAMINOPHEN 325 MG PO TABS
650.0000 mg | ORAL_TABLET | Freq: Four times a day (QID) | ORAL | Status: DC | PRN
  Administered 2022-01-16: 650 mg via ORAL
  Filled 2022-01-16: qty 2

## 2022-01-16 MED ORDER — PROPOFOL 10 MG/ML IV BOLUS
INTRAVENOUS | Status: DC | PRN
  Administered 2022-01-16: 10 mg via INTRAVENOUS
  Administered 2022-01-16: 50 mg via INTRAVENOUS

## 2022-01-16 MED ORDER — MAGNESIUM SULFATE 2 GM/50ML IV SOLN
2.0000 g | Freq: Once | INTRAVENOUS | Status: AC
  Administered 2022-01-16: 2 g via INTRAVENOUS
  Filled 2022-01-16: qty 50

## 2022-01-16 NOTE — Care Management (Signed)
1347 01-16-22 Case Manager spoke with patient regarding Tikosyn cost. Patient states she does not have Medicare D and she get her Rx's from [facility]. Patient would like her initial Rx filled via [facility] and Rx refills for 90 day supply escribed to the [facility]. NO further needs identified.

## 2022-01-16 NOTE — Progress Notes (Signed)
Pharmacy: Dofetilide (Tikosyn) - Follow Up Assessment and Electrolyte Replacement  Pharmacy consulted to assist in monitoring and replacing electrolytes in this 66 y.o. female admitted on 01/14/2022 undergoing dofetilide initiation.   Labs:    Component Value Date/Time   K 4.1 01/16/2022 0529   MG 2.0 01/16/2022 0529     Plan: Potassium: K >/= 4: No additional supplementation needed  Magnesium: Mg 1.8-2: Give Mg 2 gm IV x1    Thank you for allowing pharmacy to participate in this patient's care   Hildred Laser, PharmD Clinical Pharmacist **Pharmacist phone directory can now be found on [email or web address] ([email or web address]).  Listed under [place].

## 2022-01-16 NOTE — CV Procedure (Signed)
Procedure: Electrical Cardioversion Indications:  Atrial Fibrillation  Procedure Details:  Consent: Risks of procedure as well as the alternatives and risks of each were explained to the (patient/caregiver).  Consent for procedure obtained.  Time Out: Verified patient identification, verified procedure, site/side was marked, verified correct patient position, special equipment/implants available, medications/allergies/relevent history reviewed, required imaging and test results available. PERFORMED.  Patient placed on cardiac monitor, pulse oximetry, supplemental oxygen as necessary.  Sedation given:  propofol per anesthesia Pacer pads placed anterior and posterior chest.  Cardioverted 2 time(s).  Cardioversion with synchronized biphasic 120J, 200J shock.  Evaluation: Findings: Post procedure EKG shows: NSR Complications: None Patient did tolerate procedure well.  Time Spent Directly with the Patient:  30 minutes   Elouise Munroe 01/16/2022, 11:19 AM

## 2022-01-16 NOTE — Progress Notes (Addendum)
Electrophysiology Rounding Note  Patient Name: Brandi Stanton Date of Encounter: 01/16/2022  Primary Cardiologist: Chrystie Nose, MD  Electrophysiologist: New to Dr. Lalla Brothers   Subjective   Pt remains in afib on Tikosyn 500 mcg BID   QTc from EKG last pm shows stable QTc at ~460  The patient is doing well today.  At this time, the patient denies chest pain, shortness of breath, or any new concerns.  Inpatient Medications    Scheduled Meds:  apixaban  5 mg Oral BID   dofetilide  500 mcg Oral BID   levothyroxine  100 mcg Oral q morning   losartan  100 mg Oral Q breakfast   metoprolol succinate  50 mg Oral Q breakfast   sodium chloride flush  3 mL Intravenous Q12H   Continuous Infusions:  sodium chloride 250 mL (01/15/22 1252)   sodium chloride     PRN Meds: sodium chloride, sodium chloride flush   Vital Signs    Vitals:   01/14/22 1938 01/15/22 0352 01/15/22 0750 01/15/22 0800  BP: 130/76 114/84 132/80   Pulse: 90 97  94  Resp: 16 18  16   Temp: 98.5 F (36.9 C) 98.4 F (36.9 C)    TempSrc: Oral Oral    SpO2: 98%   98%  Weight:      Height:        Intake/Output Summary (Last 24 hours) at 01/16/2022 0653 Last data filed at 01/15/2022 2030 Gross per 24 hour  Intake 120 ml  Output --  Net 120 ml   Filed Weights   01/14/22 1223  Weight: 97.4 kg    Physical Exam    GEN- The patient is well appearing, alert and oriented x 3 today.   Head- normocephalic, atraumatic Eyes-  Sclera clear, conjunctiva pink Ears- hearing intact Oropharynx- clear Neck- supple Lungs- Clear to ausculation bilaterally, normal work of breathing Heart- Irregularly irregular rate and rhythm, no murmurs, rubs or gallops GI- soft, NT, ND, + BS Extremities- no clubbing, cyanosis, or edema Skin- no rash or lesion Psych- euthymic mood, full affect Neuro- strength and sensation are intact  Labs    CBC No results for input(s): "WBC", "NEUTROABS", "HGB", "HCT", "MCV", "PLT" in  the last 72 hours. Basic Metabolic Panel Recent Labs    01/16/22 1020 01/15/22 0349  NA 139 141  K 5.1 4.5  CL 105 104  CO2 27 26  GLUCOSE 98 126*  BUN 18 17  CREATININE 0.94 1.02*  CALCIUM 9.3 9.4  MG 2.1 1.9    Potassium  Date/Time Value Ref Range Status  01/15/2022 03:49 AM 4.5 3.5 - 5.1 mmol/L Final   Magnesium  Date/Time Value Ref Range Status  01/15/2022 03:49 AM 1.9 1.7 - 2.4 mg/dL Final    Comment:    Performed at [facility], [address] [place], [place]    Telemetry    AF 80s-140s (personally reviewed)  Radiology    No results found.   Patient Profile     Brandi Stanton is a 66 y.o. female with a past medical history significant for persistent atrial fibrillation.  They were admitted for tikosyn load.   Assessment & Plan    Persistent atrial fibrillation Pt remains in afib on Tikosyn 500 mcg BID  Continue Eliquis Creatinine, ser  1.02* (09/20 0349) Magnesium  1.9 (09/20 0349) Potassium4.5 (09/20 0349) Supplement Mg  If pt does not convert chemically, plan on DCCV this afternoon  For questions or updates, please contact [place] Please consult [email or web address] for contact info under Cardiology/STEMI.  Signed, Shirley Friar, PA-C  01/16/2022, 6:53 AM

## 2022-01-16 NOTE — Interval H&P Note (Signed)
History and Physical Interval Note:  01/16/2022 10:49 AM  Brandi Stanton  has presented today for surgery, with the diagnosis of afib.  The various methods of treatment have been discussed with the patient and family. After consideration of risks, benefits and other options for treatment, the patient has consented to  Procedure(s): CARDIOVERSION (N/A) as a surgical intervention.  The patient's history has been reviewed, patient examined, no change in status, stable for surgery.  I have reviewed the patient's chart and labs.  Questions were answered to the patient's satisfaction.     Elouise Munroe

## 2022-01-16 NOTE — Progress Notes (Signed)
Morning EKG reviewed     Shows is in NSR s/p DCC at 79 bpm with stable QTc at ~450 ms, though has a slightly prolonged appearance. Will review with MD.   Continue  Tikosyn 500 mcg BID.   Potassium4.1 (09/21 0529) Magnesium  2.0 (09/21 0529) Creatinine, ser  0.88 (09/21 0529)  Plan for home Friday if QTc remains stable   Annamaria Helling  Pager: [phone]  01/16/2022 12:29 PM

## 2022-01-16 NOTE — H&P (View-Only) (Signed)
 Electrophysiology Rounding Note  Patient Name: Brandi Stanton Date of Encounter: 01/16/2022  Primary Cardiologist: Kenneth C Hilty, MD  Electrophysiologist: New to Dr. Lambert   Subjective   Pt remains in afib on Tikosyn 500 mcg BID   QTc from EKG last pm shows stable QTc at ~460  The patient is doing well today.  At this time, the patient denies chest pain, shortness of breath, or any new concerns.  Inpatient Medications    Scheduled Meds:  apixaban  5 mg Oral BID   dofetilide  500 mcg Oral BID   levothyroxine  100 mcg Oral q morning   losartan  100 mg Oral Q breakfast   metoprolol succinate  50 mg Oral Q breakfast   sodium chloride flush  3 mL Intravenous Q12H   Continuous Infusions:  sodium chloride 250 mL (01/15/22 1252)   sodium chloride     PRN Meds: sodium chloride, sodium chloride flush   Vital Signs    Vitals:   01/14/22 1938 01/15/22 0352 01/15/22 0750 01/15/22 0800  BP: 130/76 114/84 132/80   Pulse: 90 97  94  Resp: 16 18  16  Temp: 98.5 F (36.9 C) 98.4 F (36.9 C)    TempSrc: Oral Oral    SpO2: 98%   98%  Weight:      Height:        Intake/Output Summary (Last 24 hours) at 01/16/2022 0653 Last data filed at 01/15/2022 2030 Gross per 24 hour  Intake 120 ml  Output --  Net 120 ml   Filed Weights   01/14/22 1223  Weight: 97.4 kg    Physical Exam    GEN- The patient is well appearing, alert and oriented x 3 today.   Head- normocephalic, atraumatic Eyes-  Sclera clear, conjunctiva pink Ears- hearing intact Oropharynx- clear Neck- supple Lungs- Clear to ausculation bilaterally, normal work of breathing Heart- Irregularly irregular rate and rhythm, no murmurs, rubs or gallops GI- soft, NT, ND, + BS Extremities- no clubbing, cyanosis, or edema Skin- no rash or lesion Psych- euthymic mood, full affect Neuro- strength and sensation are intact  Labs    CBC No results for input(s): "WBC", "NEUTROABS", "HGB", "HCT", "MCV", "PLT" in  the last 72 hours. Basic Metabolic Panel Recent Labs    01/14/22 1020 01/15/22 0349  NA 139 141  K 5.1 4.5  CL 105 104  CO2 27 26  GLUCOSE 98 126*  BUN 18 17  CREATININE 0.94 1.02*  CALCIUM 9.3 9.4  MG 2.1 1.9    Potassium  Date/Time Value Ref Range Status  01/15/2022 03:49 AM 4.5 3.5 - 5.1 mmol/L Final   Magnesium  Date/Time Value Ref Range Status  01/15/2022 03:49 AM 1.9 1.7 - 2.4 mg/dL Final    Comment:    Performed at [facility], [address] [place], [place] [address]    Telemetry    AF 80s-140s (personally reviewed)  Radiology    No results found.   Patient Profile     Brandi Stanton is a 66 y.o. female with a past medical history significant for persistent atrial fibrillation.  They were admitted for tikosyn load.   Assessment & Plan    Persistent atrial fibrillation Pt remains in afib on Tikosyn 500 mcg BID  Continue Eliquis Creatinine, ser  1.02* (09/20 0349) Magnesium  1.9 (09/20 0349) Potassium4.5 (09/20 0349) Supplement Mg  If pt does not convert chemically, plan on DCCV this afternoon      For questions or updates, please contact [place] Please consult [email or web address] for contact info under Cardiology/STEMI.  Signed, Shirley Friar, PA-C  01/16/2022, 6:53 AM

## 2022-01-16 NOTE — Discharge Instructions (Addendum)

## 2022-01-16 NOTE — Anesthesia Preprocedure Evaluation (Addendum)
Anesthesia Evaluation  Patient identified by MRN, date of birth, ID band Patient awake    Reviewed: Allergy & Precautions, NPO status , Patient's Chart, lab work & pertinent test results  History of Anesthesia Complications Negative for: history of anesthetic complications  Airway Mallampati: III  TM Distance: >3 FB Neck ROM: Full    Dental  (+) Dental Advisory Given   Pulmonary neg shortness of breath, neg sleep apnea, neg COPD, neg recent URI, former smoker,    breath sounds clear to auscultation       Cardiovascular hypertension, Pt. on medications +CHF  + dysrhythmias Atrial Fibrillation  Rhythm:Irregular     Neuro/Psych negative neurological ROS  negative psych ROS   GI/Hepatic negative GI ROS, Neg liver ROS,   Endo/Other  Hypothyroidism   Renal/GU Lab Results      Component                Value               Date                      CREATININE               0.88                01/16/2022                Musculoskeletal negative musculoskeletal ROS (+)   Abdominal   Peds  Hematology Lab Results      Component                Value               Date                      WBC                      7.6                 11/12/2021                HGB                      15.0                11/12/2021                HCT                      45.5                11/12/2021                MCV                      89.2                11/12/2021                PLT                      259                 11/12/2021              Anesthesia Other Findings   Reproductive/Obstetrics  Anesthesia Physical Anesthesia Plan  ASA: 3  Anesthesia Plan: General   Post-op Pain Management: Minimal or no pain anticipated   Induction: Intravenous  PONV Risk Score and Plan: 3 and Treatment may vary due to age or medical condition  Airway Management Planned: Natural Airway and  Mask  Additional Equipment: None  Intra-op Plan:   Post-operative Plan:   Informed Consent: I have reviewed the patients History and Physical, chart, labs and discussed the procedure including the risks, benefits and alternatives for the proposed anesthesia with the patient or authorized representative who has indicated his/her understanding and acceptance.     Dental advisory given  Plan Discussed with: CRNA  Anesthesia Plan Comments:         Anesthesia Quick Evaluation

## 2022-01-16 NOTE — Plan of Care (Signed)

## 2022-01-16 NOTE — Transfer of Care (Signed)
Immediate Anesthesia Transfer of Care Note  Patient: Brandi Stanton  Procedure(s) Performed: CARDIOVERSION  Patient Location: PACU  Anesthesia Type:MAC  Level of Consciousness: drowsy  Airway & Oxygen Therapy: Patient Spontanous Breathing  Post-op Assessment: Report given to RN and Post -op Vital signs reviewed and stable  Post vital signs: Reviewed and stable  Last Vitals:  Vitals Value Taken Time  BP 114/83   Temp    Pulse 76   Resp 18   SpO2 98     Last Pain:  Vitals:   01/16/22 0943  TempSrc: Oral  PainSc: 0-No pain      Patients Stated Pain Goal: 0 (11/91/47 8295)  Complications: No notable events documented.

## 2022-01-17 ENCOUNTER — Other Ambulatory Visit (HOSPITAL_COMMUNITY): Payer: Self-pay

## 2022-01-17 DIAGNOSIS — I4819 Other persistent atrial fibrillation: Secondary | ICD-10-CM | POA: Diagnosis not present

## 2022-01-17 LAB — MAGNESIUM: Magnesium: 2.3 mg/dL (ref 1.7–2.4)

## 2022-01-17 LAB — BASIC METABOLIC PANEL
Anion gap: 7 (ref 5–15)
BUN: 20 mg/dL (ref 8–23)
CO2: 25 mmol/L (ref 22–32)
Calcium: 8.6 mg/dL — ABNORMAL LOW (ref 8.9–10.3)
Chloride: 104 mmol/L (ref 98–111)
Creatinine, Ser: 0.98 mg/dL (ref 0.44–1.00)
GFR, Estimated: >60 mL/min (ref >60)
Glucose, Bld: 139 mg/dL — ABNORMAL HIGH (ref 70–99)
Potassium: 4.1 mmol/L (ref 3.5–5.1)
Sodium: 136 mmol/L (ref 135–145)

## 2022-01-17 MED ORDER — ACETAMINOPHEN 325 MG PO TABS: 650.0000 mg | ORAL_TABLET | Freq: Four times a day (QID) | ORAL | Status: AC | PRN

## 2022-01-17 MED ORDER — DOFETILIDE 500 MCG PO CAPS
500.0000 ug | ORAL_CAPSULE | Freq: Two times a day (BID) | ORAL | 6 refills | Status: DC
  Filled 2022-01-17: qty 60, 30d supply, fill #0

## 2022-01-17 MED ORDER — METOPROLOL SUCCINATE ER 50 MG PO TB24
75.0000 mg | ORAL_TABLET | Freq: Every day | ORAL | 6 refills | Status: DC
  Filled 2022-01-17: qty 45, 30d supply, fill #0

## 2022-01-17 NOTE — Discharge Summary (Addendum)
ELECTROPHYSIOLOGY PROCEDURE DISCHARGE SUMMARY    Patient ID: CARIGAN LISTER,  MRN: [id number], DOB/AGE: Mar 11, 1956 66 y.o.  Admit date: 01/14/2022 Discharge date: 01/17/2022  Primary Care Physician: Pcp, No  Primary Cardiologist: Chrystie Nose, MD  Electrophysiologist: Dr. Lalla Brothers   Primary Discharge Diagnosis:  1.  Persistent atrial fibrillation status post Tikosyn loading this admission   Allergies  Allergen Reactions   Penicillins Anaphylaxis   Lidocaine Rash    Reaction to lidocaine patch     Procedures This Admission:  1.  Tikosyn loading  2.  Direct current cardioversion on Thursday January 16, 2022 by Dr Jacques Navy which successfully restored SR.  There were no early apparent complications.   Brief HPI: Brandi Stanton is a 66 y.o. female with a past medical history as noted above.  They were referred to EP for treatment options of atrial fibrillation.  Risks, benefits, and alternatives to Tikosyn were reviewed with the patient who wished to proceed with admission for loading.  Hospital Course:  The patient was admitted and Tikosyn was initiated.  Renal function and electrolytes were followed during the hospitalization.  Their QTc remained stable. On 01/16/2022 they underwent direct current cardioversion which restored sinus rhythm. The patients QTc remained stable. They were monitored on telemetry up to discharge. On the day of discharge, they were examined by Dr. Lalla Brothers  who considered them stable for discharge to home.  Follow-up has been arranged with the Atrial Fibrillation clinic in approximately 1 week.   Pt had brief run of NSVT on day of discharge. With stable QT, Metoprolol increased and felt appropriate to discharge on same dose of tikosyn with close follow up as scheduled.   Physical Exam: Vitals:   01/16/22 2029 01/17/22 0632 01/17/22 0818 01/17/22 0841  BP: 115/64 129/77 129/72 123/63  Pulse:  77 83 78  Resp: 18 18  17   Temp: 98.1 F (36.7 C)  98.3 F (36.8 C)  98.8 F (37.1 C)  TempSrc: Oral Oral    SpO2: 93% 95%    Weight:      Height:        GEN- The patient is well appearing, alert and oriented x 3 today.   HEENT: normocephalic, atraumatic; sclera clear, conjunctiva pink; hearing intact; oropharynx clear; neck supple, no JVP Lymph- no cervical lymphadenopathy Lungs- Clear to ausculation bilaterally, normal work of breathing.  No wheezes, rales, rhonchi Heart- Regular rate and rhythm, no murmurs, rubs or gallops, PMI not laterally displaced GI- soft, non-tender, non-distended, bowel sounds present, no hepatosplenomegaly Extremities- no clubbing, cyanosis, or edema; DP/PT/radial pulses 2+ bilaterally MS- no significant deformity or atrophy Skin- warm and dry, no rash or lesion Psych- euthymic mood, full affect Neuro- strength and sensation are intact  Labs:   Lab Results  Component Value Date   WBC 7.6 11/12/2021   HGB 15.0 11/12/2021   HCT 45.5 11/12/2021   MCV 89.2 11/12/2021   PLT 259 11/12/2021    Recent Labs  Lab 01/17/22 0409  NA 136  K 4.1  CL 104  CO2 25  BUN 20  CREATININE 0.98  CALCIUM 8.6*  GLUCOSE 139*    Discharge Medications:  Allergies as of 01/17/2022       Reactions   Penicillins Anaphylaxis   Lidocaine Rash   Reaction to lidocaine patch        Medication List     TAKE these medications    acetaminophen 325 MG tablet Commonly known as: TYLENOL Take 2  tablets (650 mg total) by mouth every 6 (six) hours as needed for moderate pain.   apixaban 5 MG Tabs tablet Commonly known as: ELIQUIS Take 1 tablet (5 mg total) by mouth 2 (two) times daily.   BIOTIN PO Take 1 tablet by mouth every morning.   dofetilide 500 MCG capsule Commonly known as: TIKOSYN Take 1 capsule (500 mcg total) by mouth 2 (two) times daily.   Fish Oil 1000 MG Caps Take 2,000 mg by mouth every morning.   FOLIC ACID PO Take 4 tablets by mouth daily.   levothyroxine 100 MCG tablet Commonly known  as: SYNTHROID Take 100 mcg by mouth every morning.   losartan 100 MG tablet Commonly known as: COZAAR Take 100 mg by mouth daily.   metoprolol succinate 50 MG 24 hr tablet Commonly known as: Toprol XL Take 1 tablet (50 mg total) by mouth daily. Take with or immediately following a meal.   multivitamin with minerals Tabs tablet Take 1 tablet by mouth every morning.   nystatin cream Commonly known as: MYCOSTATIN Apply 1 Application topically as needed for dry skin.        Disposition:    Follow-up Information     [facility] Follow up.   Specialty: Cardiology Why: on 9/28 at 2 pm for post hospital Contact information: [address] [id number] [place] [phone]                Duration of Discharge Encounter: Greater than 30 minutes including physician time.  Jacalyn Lefevre, PA-C  01/17/2022 11:24 AM

## 2022-01-17 NOTE — Care Management Important Message (Signed)
Important Message  Patient Details  Name: OBERA STAUCH MRN: [id number] Date of Birth: Feb 22, 1956   Medicare Important Message Given:  Yes     Shelda Altes 01/17/2022, 9:47 AM

## 2022-01-17 NOTE — Plan of Care (Signed)

## 2022-01-17 NOTE — Progress Notes (Signed)
Pharmacy: Dofetilide (Tikosyn) - Follow Up Assessment and Electrolyte Replacement  Pharmacy consulted to assist in monitoring and replacing electrolytes in this 66 y.o. female admitted on 01/14/2022 undergoing dofetilide initiation.   Labs:    Component Value Date/Time   K 4.1 01/17/2022 0409   MG 2.3 01/17/2022 0409     Plan: Potassium: K >/= 4: No additional supplementation needed  Magnesium: Mg > 2: No additional supplementation needed   He has not received any potassium during his admission and should not need daily supplementation at discharge  Thank you for allowing pharmacy to participate in this patient's care    Hildred Laser, PharmD Clinical Pharmacist **Pharmacist phone directory can now be found on [email or web address] ([email or web address]).  Listed under [place].

## 2022-01-17 NOTE — Progress Notes (Signed)
EKG from yesterday evening 01/16/2022 reviewed     Shows remains in NSR with stable QTc at ~430 ms.  Continue  Tikosyn 500 mcg BID.   Potassium4.1 (09/22 0409) Magnesium  2.3 (09/22 0409) Creatinine, ser  0.98 (09/22 0409)  Plan for home this afternoon if QTc remains stable.   Shirley Friar, PA-C  Pager: [phone]  01/17/2022 7:04 AM

## 2022-01-17 NOTE — Progress Notes (Signed)
Discharge instructions (including medications) discussed with and copy provided to patient/caregiver 

## 2022-01-17 NOTE — Plan of Care (Signed)

## 2022-01-17 NOTE — Anesthesia Postprocedure Evaluation (Signed)
Anesthesia Post Note  Patient: Brandi Stanton  Procedure(s) Performed: CARDIOVERSION     Patient location during evaluation: Endoscopy Anesthesia Type: General Level of consciousness: awake and alert Pain management: pain level controlled Vital Signs Assessment: post-procedure vital signs reviewed and stable Respiratory status: spontaneous breathing, nonlabored ventilation and respiratory function stable Cardiovascular status: blood pressure returned to baseline and stable Postop Assessment: no apparent nausea or vomiting Anesthetic complications: no   No notable events documented.  Last Vitals:  Vitals:   01/17/22 0632 01/17/22 0818  BP: 129/77 129/72  Pulse: 77 83  Resp: 18   Temp: 36.8 C   SpO2: 95%     Last Pain:  Vitals:   01/17/22 0632  TempSrc: Oral  PainSc:                  [name]

## 2022-01-19 ENCOUNTER — Encounter (HOSPITAL_COMMUNITY): Payer: Self-pay | Admitting: Internal Medicine

## 2022-01-23 ENCOUNTER — Ambulatory Visit (HOSPITAL_COMMUNITY)
Admit: 2022-01-23 | Discharge: 2022-01-23 | Disposition: A | Discharge status: Home / Self Care | Payer: Medicare Other | Attending: Physician Assistant | Admitting: Physician Assistant

## 2022-01-23 ENCOUNTER — Encounter (HOSPITAL_COMMUNITY): Payer: Self-pay | Admitting: Physician Assistant

## 2022-01-23 VITALS — BP 134/80 | HR 76 | Ht 67.0 in | Wt 215.2 lb

## 2022-01-23 DIAGNOSIS — E669 Obesity, unspecified: Secondary | ICD-10-CM | POA: Insufficient documentation

## 2022-01-23 DIAGNOSIS — I4819 Other persistent atrial fibrillation: Secondary | ICD-10-CM | POA: Insufficient documentation

## 2022-01-23 DIAGNOSIS — I5022 Chronic systolic (congestive) heart failure: Secondary | ICD-10-CM | POA: Diagnosis not present

## 2022-01-23 DIAGNOSIS — I110 Hypertensive heart disease with heart failure: Secondary | ICD-10-CM | POA: Diagnosis not present

## 2022-01-23 DIAGNOSIS — D6869 Other thrombophilia: Secondary | ICD-10-CM | POA: Diagnosis not present

## 2022-01-23 LAB — BASIC METABOLIC PANEL
Anion gap: 7 (ref 5–15)
BUN: 15 mg/dL (ref 8–23)
CO2: 24 mmol/L (ref 22–32)
Calcium: 8.9 mg/dL (ref 8.9–10.3)
Chloride: 107 mmol/L (ref 98–111)
Creatinine, Ser: 0.98 mg/dL (ref 0.44–1.00)
GFR, Estimated: >60 mL/min (ref >60)
Glucose, Bld: 110 mg/dL — ABNORMAL HIGH (ref 70–99)
Potassium: 4.6 mmol/L (ref 3.5–5.1)
Sodium: 138 mmol/L (ref 135–145)

## 2022-01-23 LAB — MAGNESIUM: Magnesium: 1.9 mg/dL (ref 1.7–2.4)

## 2022-01-23 MED ORDER — DOFETILIDE 500 MCG PO CAPS: 500.0000 ug | ORAL_CAPSULE | Freq: Two times a day (BID) | ORAL | 1 refills | Status: DC

## 2022-01-23 NOTE — Progress Notes (Signed)
Primary Care Physician: Pcp, No Referring Physician: [facility] f/u  Primary EP: Dr Malen Gauze is a 66 y.o. female with a h/o HTN, smoking, obesity who presented to ED 11/07/21 with several weeks of progressive palpitations, dyspnea on exertion, cough and orthopnea.   In the ER, CXR showed bilateral infiltrates and she was found to be in new onset Afib. She was admitted and started on rate control agents. Echo 7/14 showed EF 30-35%.  She was diuresed and rates were controlled.  She was weaned off O2 and appeared euvolemic off diuretics.  DCCV was attempted but failed.  Given her sbuclinical hypothyroidism, amiodarone was deferred, rates were controlled with metoprolol and she was discharged on metoprolol and  Eliquis.    She is now in the afib clinic for f/u. She states that she has now stopped smoking. She has not had a PCP and has not been to a MD in years. She feels good today. She remains in rate controlled afib. Duration of afib is unknown as she can't tell she is in it. Shortness of breath and  cough is what brought her to the hospital. These symptoms have resolved. She tried to get a f/u with a PCP but was told she would get a call when there was an appointment available. Her weight is staying normal. TSH in the hospital was 35. She is being compliant with eliquis 5 mg bid. On metoprolol for rate control.   F/u in afib clinic 8/16. She  did have f/u for her elevated TSH and thought to represent  subclinical hypothyroidism. She was given a rx for levothyroxine but had not started yet. She remains in rate controlled afib, duration  unknown. She feels ok in afib but  with reduced EF feel that SR needs to be restored. Amiodarone is being avoided 2/2 thyroid issues. Ablation cases are scheduled out to December. I discussed tikosyn with a possible later ablation down the road.  She was in agreement.   F/u afib clinic 01/13/22. She is here for tikosyn admit. Qtc at 418 ms, no benadryl use. No  missed apixaban for at least 3 weeks. General  info re tikosyn admit. She remains in afib at 108 bpm.   Follow up in the AF clinic 01/23/22. Patient is s/p dofetilide loading 9/19-9/22/23 with DCCV on 01/16/22. She feels well today and remains in SR. No issues with the medication.   Today, she denies symptoms of palpitations, chest pain, shortness of breath, orthopnea, PND, lower extremity edema, dizziness, presyncope, syncope, or neurologic sequela. The patient is tolerating medications without difficulties and is otherwise without complaint today.   Past Medical History:  Diagnosis Date   Atrial fibrillation (HCC)    Bronchitis    Hypertension    Past Surgical History:  Procedure Laterality Date   BREAST REDUCTION SURGERY     CARDIOVERSION N/A 11/11/2021   Procedure: CARDIOVERSION;  Surgeon: Wendall Stade, MD;  Location: [facility] ENDOSCOPY;  Service: Cardiovascular;  Laterality: N/A;   CARDIOVERSION N/A 01/16/2022   Procedure: CARDIOVERSION;  Surgeon: Parke Poisson, MD;  Location: [facility] ENDOSCOPY;  Service: Cardiovascular;  Laterality: N/A;   OTHER SURGICAL HISTORY     LEAP procedure   TEE WITHOUT CARDIOVERSION N/A 11/11/2021   Procedure: TRANSESOPHAGEAL ECHOCARDIOGRAM (TEE);  Surgeon: Wendall Stade, MD;  Location: [facility] ENDOSCOPY;  Service: Cardiovascular;  Laterality: N/A;   TUBAL LIGATION Bilateral     Current Outpatient Medications  Medication Sig Dispense Refill   acetaminophen (  TYLENOL) 325 MG tablet Take 2 tablets (650 mg total) by mouth every 6 (six) hours as needed for moderate pain.     apixaban (ELIQUIS) 5 MG TABS tablet Take 1 tablet (5 mg total) by mouth 2 (two) times daily. 60 tablet 3   BIOTIN PO Take 1 tablet by mouth every morning.     FOLIC ACID PO Take 4 tablets by mouth daily.     levothyroxine (SYNTHROID) 100 MCG tablet Take 100 mcg by mouth every morning.     losartan (COZAAR) 100 MG tablet Take 100 mg by mouth daily.     metoprolol succinate (TOPROL XL) 50 MG 24  hr tablet Take 1.5 tablets (75 mg total) by mouth daily. Take with or immediately following a meal. 45 tablet 6   Multiple Vitamin (MULTIVITAMIN WITH MINERALS) TABS tablet Take 1 tablet by mouth every morning.     nystatin cream (MYCOSTATIN) Apply 1 Application topically as needed for dry skin.     Omega-3 Fatty Acids (FISH OIL) 1000 MG CAPS Take 2,000 mg by mouth every morning.     dofetilide (TIKOSYN) 500 MCG capsule Take 1 capsule (500 mcg total) by mouth 2 (two) times daily. 180 capsule 1   No current facility-administered medications for this encounter.    Allergies  Allergen Reactions   Penicillins Anaphylaxis   Lidocaine Rash    Reaction to lidocaine patch    Social History   Socioeconomic History   Marital status: Widowed    Spouse name: Not on file   Number of children: Not on file   Years of education: Not on file   Highest education level: Not on file  Occupational History   Not on file  Tobacco Use   Smoking status: Former    Packs/day: 1.00    Years: 11.00    Total pack years: 11.00    Types: Cigarettes    Start date: 08/07/2010    Quit date: 11/07/2021    Years since quitting: 0.2   Smokeless tobacco: Not on file   Tobacco comments:    Former smoker 01/23/22  Vaping Use   Vaping Use: Never used  Substance and Sexual Activity   Alcohol use: Yes    Comment: rarely 2-3 times a year   Drug use: Never   Sexual activity: Not Currently  Other Topics Concern   Not on file  Social History Narrative   Lives with son and daughter in law   Social Determinants of Health   Financial Resource Strain: Not on file  Food Insecurity: No Food Insecurity (01/14/2022)   Hunger Vital Sign    Worried About Running Out of Food in the Last Year: Never true    Ran Out of Food in the Last Year: Never true  Transportation Needs: No Transportation Needs (01/14/2022)   PRAPARE - Hydrologist (Medical): No    Lack of Transportation (Non-Medical): No   Physical Activity: Not on file  Stress: Not on file  Social Connections: Not on file  Intimate Partner Violence: Not At Risk (01/14/2022)   Humiliation, Afraid, Rape, and Kick questionnaire    Fear of Current or Ex-Partner: No    Emotionally Abused: No    Physically Abused: No    Sexually Abused: No    Family History  Problem Relation Age of Onset   Diabetes Mother    Hypertension Mother    Rheumatic fever Father    Heart disease Father  Secondary to rheumatic fever.    ROS- All systems are reviewed and negative except as per the HPI above  Physical Exam: Vitals:   01/23/22 1345  BP: 134/80  Pulse: 76  Weight: 97.6 kg  Height: 5\' 7"  (1.702 m)    Wt Readings from Last 3 Encounters:  01/23/22 97.6 kg  01/16/22 97.4 kg  01/14/22 97.8 kg    Labs: Lab Results  Component Value Date   NA 136 01/17/2022   K 4.1 01/17/2022   CL 104 01/17/2022   CO2 25 01/17/2022   GLUCOSE 139 (H) 01/17/2022   BUN 20 01/17/2022   CREATININE 0.98 01/17/2022   CALCIUM 8.6 (L) 01/17/2022   MG 2.3 01/17/2022   Lab Results  Component Value Date   INR 1.2 01/15/2022   No results found for: "CHOL", "HDL", "LDLCALC", "TRIG"   GEN- The patient is a well appearing female, alert and oriented x 3 today.   HEENT-head normocephalic, atraumatic, sclera clear, conjunctiva pink, hearing intact, trachea midline. Lungs- Clear to ausculation bilaterally, normal work of breathing Heart- Regular rate and rhythm, no murmurs, rubs or gallops  GI- soft, NT, ND, + BS Extremities- no clubbing, cyanosis, or edema MS- no significant deformity or atrophy Skin- no rash or lesion Psych- euthymic mood, full affect Neuro- strength and sensation are intact   EKG-  SR Vent. rate 76 BPM PR interval 150 ms QRS duration 70 ms QT/QTcB 404/454 ms   Echo- 1. Left ventricular ejection fraction, by estimation, is 30 to 35%. The left ventricle has moderately decreased function. The left ventricle  demonstrates global hypokinesis. The left ventricular internal cavity size was moderately dilated. There is mild  left ventricular hypertrophy. Left ventricular diastolic parameters are indeterminate.   2. Right ventricular systolic function is normal. The right ventricular size is normal. There is normal pulmonary artery systolic pressure.   3. Left atrial size was mildly dilated.   4. The mitral valve is normal in structure. Mild mitral valve  regurgitation. No evidence of mitral stenosis.   5. The aortic valve is normal in structure. Aortic valve regurgitation is not visualized. No aortic stenosis is present.   6. The inferior vena cava is normal in size with greater than 50% respiratory variability, suggesting right atrial pressure of 3 mmHg.     Assessment and Plan:  1. Persistent atrial fibrillation  S/p dofetilide loading 9/19-9/22/23 with DCCV on 01/16/22 Patient appears to be maintaining SR. Continue dofetilide 500 mcg BID. QT stable. Check bmet/mag today. Continue Toprol 50 mg daily Continue Eliquis 5 mg BID  2. CHA2DS2VASc  score of of 3  Continue  eliquis 5 mg bid  No missed doses  3. Chronic HFrEF Fluid status appears stable today.   Follow up in the AF clinic in one month.    01/18/22 PA-C Afib Clinic [facility] [address] [place], [place] [phone]

## 2022-02-20 ENCOUNTER — Ambulatory Visit (HOSPITAL_COMMUNITY)
Admission: RE | Admit: 2022-02-20 | Discharge: 2022-02-20 | Disposition: A | Discharge status: Home / Self Care | Payer: Medicare Other | Source: Ambulatory Visit | Attending: Nurse Practitioner | Admitting: Nurse Practitioner

## 2022-02-20 ENCOUNTER — Encounter (HOSPITAL_COMMUNITY): Payer: Self-pay | Admitting: Nurse Practitioner

## 2022-02-20 VITALS — BP 162/86 | HR 65 | Ht 67.0 in | Wt 222.4 lb

## 2022-02-20 DIAGNOSIS — I4819 Other persistent atrial fibrillation: Secondary | ICD-10-CM | POA: Diagnosis not present

## 2022-02-20 DIAGNOSIS — I4891 Unspecified atrial fibrillation: Secondary | ICD-10-CM | POA: Diagnosis present

## 2022-02-20 DIAGNOSIS — I110 Hypertensive heart disease with heart failure: Secondary | ICD-10-CM | POA: Diagnosis not present

## 2022-02-20 DIAGNOSIS — Z7901 Long term (current) use of anticoagulants: Secondary | ICD-10-CM | POA: Diagnosis not present

## 2022-02-20 DIAGNOSIS — D6869 Other thrombophilia: Secondary | ICD-10-CM | POA: Diagnosis not present

## 2022-02-20 DIAGNOSIS — F17200 Nicotine dependence, unspecified, uncomplicated: Secondary | ICD-10-CM | POA: Diagnosis not present

## 2022-02-20 DIAGNOSIS — E669 Obesity, unspecified: Secondary | ICD-10-CM | POA: Insufficient documentation

## 2022-02-20 DIAGNOSIS — I509 Heart failure, unspecified: Secondary | ICD-10-CM | POA: Insufficient documentation

## 2022-02-20 LAB — BASIC METABOLIC PANEL
Anion gap: 8 (ref 5–15)
BUN: 19 mg/dL (ref 8–23)
CO2: 28 mmol/L (ref 22–32)
Calcium: 9.4 mg/dL (ref 8.9–10.3)
Chloride: 102 mmol/L (ref 98–111)
Creatinine, Ser: 1.1 mg/dL — ABNORMAL HIGH (ref 0.44–1.00)
GFR, Estimated: 55 mL/min — ABNORMAL LOW (ref >60)
Glucose, Bld: 127 mg/dL — ABNORMAL HIGH (ref 70–99)
Potassium: 4.8 mmol/L (ref 3.5–5.1)
Sodium: 138 mmol/L (ref 135–145)

## 2022-02-20 LAB — MAGNESIUM: Magnesium: 2 mg/dL (ref 1.7–2.4)

## 2022-02-20 NOTE — Progress Notes (Signed)
Primary Care Physician: Pcp, No Referring Physician: [facility] f/u    Brandi Stanton is a 66 y.o. female with a h/o HTN, smoking, obesity who presented to ED 11/07/21 with several weeks of progressive palpitations, dyspnea on exertion, cough and orthopnea.   In the ER, CXR showed bilateral infiltrates and she was found to be in new onset Afib. She was admitted and started on rate control agents. Echo 7/14 showed EF 30-35%.  She was diuresed and rates were controlled.  She was weaned off O2 and appeared euvolemic off diuretics.  DCCV was attempted but failed.  Given her sbuclinical hypothyroidism, amiodarone was deferred, rates were controlled with metoprolol and she was discharged on metoprolol and  Eliquis.    She is now in the afib clinic for f/u. She states that she has now stopped smoking. She has not had a PCP and has not been to a MD in years. She feels good today. She remains in rate controlled afib. Duration of afib is unknown as she can't tell she is in it. Shortness of breath and  cough is what brought her to the hospital. These symptoms have resolved. She tried to get a f/u with a PCP but was told she would get a call when there was an appointment available. Her weight is staying normal. TSH in the hospital was 35. She is being compliant with eliquis 5 mg bid. On metoprolol for rate control.   F/u in afib clinic 8/16. She  did have f/u for her elevated TSH and thought to represent  subclinical hypothyroidism. She was given a rx for levothyroxine but had not started yet. She remains in rate controlled afib, duration  unknown. She feels ok in afib but  with reduced EF feel that SR needs to be restored. Amiodarone is being avoided 2/2 thyroid issues. Ablation cases are scheduled out to December. I discussed tikosyn with a possible later ablation down the road.  She was in agreement.   F/u afib clinic 01/13/22. She is here for tikosyn admit. Qtc at 418 ms, no benadryl use. No missed apixaban for at  least 3 weeks. General  info re tikosyn admit. She remains in afib at 108 bpm.   F/u in afib clinic, 02/20/22. She is doing well on Tikosyn, staying in SR. QT is stable. She is compliant with Tikosyn. She has stopped smoking and has gained 20 lbs. Her BP here is elevated at 162/86, at home running around 140 systolic. We discussed dietary management  and exercise to lose weight which should help to control her BP better.  Today, she denies symptoms of palpitations, chest pain, shortness of breath, orthopnea, PND, lower extremity edema, dizziness, presyncope, syncope, or neurologic sequela. The patient is tolerating medications without difficulties and is otherwise without complaint today.   Past Medical History:  Diagnosis Date   Atrial fibrillation (HCC)    Bronchitis    Hypertension    Past Surgical History:  Procedure Laterality Date   BREAST REDUCTION SURGERY     CARDIOVERSION N/A 11/11/2021   Procedure: CARDIOVERSION;  Surgeon: Wendall Stade, MD;  Location: [facility] ENDOSCOPY;  Service: Cardiovascular;  Laterality: N/A;   CARDIOVERSION N/A 01/16/2022   Procedure: CARDIOVERSION;  Surgeon: Parke Poisson, MD;  Location: [facility] ENDOSCOPY;  Service: Cardiovascular;  Laterality: N/A;   OTHER SURGICAL HISTORY     LEAP procedure   TEE WITHOUT CARDIOVERSION N/A 11/11/2021   Procedure: TRANSESOPHAGEAL ECHOCARDIOGRAM (TEE);  Surgeon: Wendall Stade, MD;  Location: [facility]  ENDOSCOPY;  Service: Cardiovascular;  Laterality: N/A;   TUBAL LIGATION Bilateral     Current Outpatient Medications  Medication Sig Dispense Refill   acetaminophen (TYLENOL) 325 MG tablet Take 2 tablets (650 mg total) by mouth every 6 (six) hours as needed for moderate pain.     apixaban (ELIQUIS) 5 MG TABS tablet Take 1 tablet (5 mg total) by mouth 2 (two) times daily. 60 tablet 3   BIOTIN PO Take 1 tablet by mouth every morning.     dofetilide (TIKOSYN) 500 MCG capsule Take 1 capsule (500 mcg total) by mouth 2 (two) times daily.  180 capsule 1   FOLIC ACID PO Take 4 tablets by mouth daily.     levothyroxine (SYNTHROID) 112 MCG tablet Take 112 mcg by mouth daily before breakfast.     losartan (COZAAR) 100 MG tablet Take 100 mg by mouth daily.     metoprolol succinate (TOPROL XL) 50 MG 24 hr tablet Take 1.5 tablets (75 mg total) by mouth daily. Take with or immediately following a meal. 45 tablet 6   Multiple Vitamin (MULTIVITAMIN WITH MINERALS) TABS tablet Take 1 tablet by mouth every morning.     nystatin cream (MYCOSTATIN) Apply 1 Application topically as needed for dry skin.     Omega-3 Fatty Acids (FISH OIL) 1000 MG CAPS Take 2,000 mg by mouth every morning.     No current facility-administered medications for this encounter.    Allergies  Allergen Reactions   Penicillins Anaphylaxis   Lidocaine Rash    Reaction to lidocaine patch    Social History   Socioeconomic History   Marital status: Widowed    Spouse name: Not on file   Number of children: Not on file   Years of education: Not on file   Highest education level: Not on file  Occupational History   Not on file  Tobacco Use   Smoking status: Former    Packs/day: 1.00    Years: 11.00    Total pack years: 11.00    Types: Cigarettes    Start date: 08/07/2010    Quit date: 11/07/2021    Years since quitting: 0.2   Smokeless tobacco: Not on file   Tobacco comments:    Former smoker 01/23/22  Vaping Use   Vaping Use: Never used  Substance and Sexual Activity   Alcohol use: Yes    Comment: rarely 2-3 times a year   Drug use: Never   Sexual activity: Not Currently  Other Topics Concern   Not on file  Social History Narrative   Lives with son and daughter in law   Social Determinants of Health   Financial Resource Strain: Not on file  Food Insecurity: No Food Insecurity (01/14/2022)   Hunger Vital Sign    Worried About Running Out of Food in the Last Year: Never true    Ran Out of Food in the Last Year: Never true  Transportation Needs:  No Transportation Needs (01/14/2022)   PRAPARE - Administrator, Civil Service (Medical): No    Lack of Transportation (Non-Medical): No  Physical Activity: Not on file  Stress: Not on file  Social Connections: Not on file  Intimate Partner Violence: Not At Risk (01/14/2022)   Humiliation, Afraid, Rape, and Kick questionnaire    Fear of Current or Ex-Partner: No    Emotionally Abused: No    Physically Abused: No    Sexually Abused: No    Family History  Problem Relation  Age of Onset   Diabetes Mother    Hypertension Mother    Rheumatic fever Father    Heart disease Father        Secondary to rheumatic fever.    ROS- All systems are reviewed and negative except as per the HPI above  Physical Exam: Vitals:   02/20/22 1344  BP: (!) 162/86  Pulse: 65  Weight: 100.9 kg  Height: 5\' 7"  (1.702 m)   Wt Readings from Last 3 Encounters:  02/20/22 100.9 kg  01/23/22 97.6 kg  01/16/22 97.4 kg    Labs: Lab Results  Component Value Date   NA 138 01/23/2022   K 4.6 01/23/2022   CL 107 01/23/2022   CO2 24 01/23/2022   GLUCOSE 110 (H) 01/23/2022   BUN 15 01/23/2022   CREATININE 0.98 01/23/2022   CALCIUM 8.9 01/23/2022   MG 1.9 01/23/2022   Lab Results  Component Value Date   INR 1.2 01/15/2022   No results found for: "CHOL", "HDL", "LDLCALC", "TRIG"   GEN- The patient is well appearing, alert and oriented x 3 today.   Head- normocephalic, atraumatic Eyes-  Sclera clear, conjunctiva pink Ears- hearing intact Oropharynx- clear Neck- supple, no JVP Lymph- no cervical lymphadenopathy Lungs- Clear to ausculation bilaterally, normal work of breathing Heart- irregular rate and rhythm, no murmurs, rubs or gallops, PMI not laterally displaced GI- soft, NT, ND, + BS Extremities- no clubbing, cyanosis, or edema MS- no significant deformity or atrophy Skin- no rash or lesion Psych- euthymic mood, full affect Neuro- strength and sensation are intact  EKG Vent.  rate 65 BPM PR interval 156 ms QRS duration 70 ms QT/QTcB 424/440 ms P-R-T axes 85 25 81 Sinus rhythm with Premature atrial complexes Low voltage QRS Cannot rule out Anterior infarct , age undetermined Abnormal ECG When compared with ECG of 23-Jan-2022 13:47, PREVIOUS ECG IS PRESENT  Echo- 1. Left ventricular ejection fraction, by estimation, is 30 to 35%. The  left ventricle has moderately decreased function. The left ventricle  demonstrates global hypokinesis. The left ventricular internal cavity size  was moderately dilated. There is mild   left ventricular hypertrophy. Left ventricular diastolic parameters are  indeterminate.   2. Right ventricular systolic function is normal. The right ventricular  size is normal. There is normal pulmonary artery systolic pressure.   3. Left atrial size was mildly dilated.   4. The mitral valve is normal in structure. Mild mitral valve  regurgitation. No evidence of mitral stenosis.   5. The aortic valve is normal in structure. Aortic valve regurgitation is  not visualized. No aortic stenosis is present.   6. The inferior vena cava is normal in size with greater than 50%  respiratory variability, suggesting right atrial pressure of 3 mmHg.     Assessment and Plan:  1. Afib S/p tikosyn admit September 2023 Staying in SR with stable qt   2. Elevated TSH Pcp  thought subclinical as T4 was not elevated She has been started on levothyroxine   3. CHA2DS2VASc  score of of 3  Continue  eliquis 5 mg bid   4. Acute congested heart failure  Normovolemic Continue metoprolol Daily weights Limit salt   4. Smoking cessation Pt congratulated She has had 20 lb weight gain with stopping smoking  Discussed diet and exercise to help  lose weight  5. HTN Elevated here, at home around 366 systolic Exercise and weight loss should help to lower BP   F/u with Dr. Debara Pickett 11/16  Afib clinic in January 2024 for tikosyn surveillance   Elvina Sidle.  Matthew Folks Afib Clinic [facility] [address] [place], [place] [address] [phone]

## 2022-03-13 ENCOUNTER — Encounter: Payer: Self-pay | Admitting: Internal Medicine

## 2022-03-13 ENCOUNTER — Ambulatory Visit: Payer: Medicare Other | Attending: Internal Medicine | Admitting: Internal Medicine

## 2022-03-13 VITALS — BP 165/91 | HR 73 | Ht 67.0 in | Wt 222.6 lb

## 2022-03-13 DIAGNOSIS — I4819 Other persistent atrial fibrillation: Secondary | ICD-10-CM | POA: Insufficient documentation

## 2022-03-13 DIAGNOSIS — Z7901 Long term (current) use of anticoagulants: Secondary | ICD-10-CM | POA: Diagnosis not present

## 2022-03-13 DIAGNOSIS — I428 Other cardiomyopathies: Secondary | ICD-10-CM | POA: Diagnosis not present

## 2022-03-13 DIAGNOSIS — Z87891 Personal history of nicotine dependence: Secondary | ICD-10-CM | POA: Diagnosis not present

## 2022-03-13 NOTE — Progress Notes (Signed)
OFFICE NOTE  Chief Complaint:  A-fib/heart failure  Primary Care Physician: Pcp, No  HPI:  Brandi Stanton is a 66 y.o. female with a past medial history significant for hospitalization over the summer with atrial fibrillation and heart failure with LVEF as low as 30 to 35%.  She had multiple cardioversions and eventually was able to achieve sinus rhythm on dofetilide.  This was with the assistance of the A-fib clinic.  She is on losartan and metoprolol for her heart failure.  She denies any worsening shortness of breath, orthopnea, lower extremity edema or other heart failure symptoms.  Her LVEF has not been reassessed since achieving sinus rhythm about 2 months ago.  EKG repeated today shows normal sinus rhythm at 73, QTc 451 ms.  PMHx:  Past Medical History:  Diagnosis Date   Atrial fibrillation (HCC)    Bronchitis    Hypertension     Past Surgical History:  Procedure Laterality Date   BREAST REDUCTION SURGERY     CARDIOVERSION N/A 11/11/2021   Procedure: CARDIOVERSION;  Surgeon: Wendall Stade, MD;  Location: [facility] ENDOSCOPY;  Service: Cardiovascular;  Laterality: N/A;   CARDIOVERSION N/A 01/16/2022   Procedure: CARDIOVERSION;  Surgeon: Parke Poisson, MD;  Location: [facility] ENDOSCOPY;  Service: Cardiovascular;  Laterality: N/A;   OTHER SURGICAL HISTORY     LEAP procedure   TEE WITHOUT CARDIOVERSION N/A 11/11/2021   Procedure: TRANSESOPHAGEAL ECHOCARDIOGRAM (TEE);  Surgeon: Wendall Stade, MD;  Location: [facility] ENDOSCOPY;  Service: Cardiovascular;  Laterality: N/A;   TUBAL LIGATION Bilateral     FAMHx:  Family History  Problem Relation Age of Onset   Diabetes Mother    Hypertension Mother    Rheumatic fever Father    Heart disease Father        Secondary to rheumatic fever.    SOCHx:   reports that she quit smoking about 4 months ago. Her smoking use included cigarettes. She started smoking about 11 years ago. She has a 11.00 pack-year smoking history. She does not have  any smokeless tobacco history on file. She reports current alcohol use. She reports that she does not use drugs.  ALLERGIES:  Allergies  Allergen Reactions   Penicillins Anaphylaxis   Lidocaine Rash    Reaction to lidocaine patch    ROS: Pertinent items noted in HPI and remainder of comprehensive ROS otherwise negative.  HOME MEDS: Current Outpatient Medications on File Prior to Visit  Medication Sig Dispense Refill   acetaminophen (TYLENOL) 325 MG tablet Take 2 tablets (650 mg total) by mouth every 6 (six) hours as needed for moderate pain.     apixaban (ELIQUIS) 5 MG TABS tablet Take 1 tablet (5 mg total) by mouth 2 (two) times daily. 60 tablet 3   BIOTIN PO Take 1 tablet by mouth every morning.     dofetilide (TIKOSYN) 500 MCG capsule Take 1 capsule (500 mcg total) by mouth 2 (two) times daily. 180 capsule 1   FOLIC ACID PO Take 4 tablets by mouth daily.     levothyroxine (SYNTHROID) 112 MCG tablet Take 112 mcg by mouth daily before breakfast.     losartan (COZAAR) 100 MG tablet Take 100 mg by mouth daily.     metoprolol succinate (TOPROL XL) 50 MG 24 hr tablet Take 1.5 tablets (75 mg total) by mouth daily. Take with or immediately following a meal. 45 tablet 6   Multiple Vitamin (MULTIVITAMIN WITH MINERALS) TABS tablet Take 1 tablet by mouth every morning.  nystatin cream (MYCOSTATIN) Apply 1 Application topically as needed for dry skin.     Omega-3 Fatty Acids (FISH OIL) 1000 MG CAPS Take 2,000 mg by mouth every morning.     No current facility-administered medications on file prior to visit.    LABS/IMAGING: No results found for this or any previous visit (from the past 48 hour(s)). No results found.  LIPID PANEL: No results found for: "CHOL", "TRIG", "HDL", "CHOLHDL", "VLDL", "LDLCALC", "LDLDIRECT"   WEIGHTS: Wt Readings from Last 3 Encounters:  03/13/22 222 lb 9.6 oz (101 kg)  02/20/22 222 lb 6.4 oz (100.9 kg)  01/23/22 215 lb 3.2 oz (97.6 kg)    VITALS: BP  (!) 165/91 (BP Location: Left Arm, Patient Position: Sitting)   Pulse 73   Ht 5\' 7"  (1.702 m)   Wt 222 lb 9.6 oz (101 kg)   LMP 04/29/2007   SpO2 97%   BMI 34.86 kg/m   EXAM: General appearance: alert and no distress Neck: no carotid bruit, no JVD, and thyroid not enlarged, symmetric, no tenderness/mass/nodules Lungs: clear to auscultation bilaterally Heart: regular rate and rhythm, S1, S2 normal, no murmur, click, rub or gallop Abdomen: soft, non-tender; bowel sounds normal; no masses,  no organomegaly Extremities: extremities normal, atraumatic, no cyanosis or edema Pulses: 2+ and symmetric Skin: Skin color, texture, turgor normal. No rashes or lesions Neurologic: Grossly normal Psych: Pleasant  EKG: NSR at 73, QTC 451 msec - personally reviewed  ASSESSMENT: Paroxysmal atrial fibrillation, on dofetilide, CHADSVasc score of 4 (female, >65, CHF) Systolic congestive heart failure, LVEF 30-35%, NYHA Class I symptoms Hypertension Former smoker  PLAN: 1.   Ms. Kulak has PAF, but has remained in sinus rhythm on dofetilide for the past 2 months.  Her LV EF was depressed at 30 to 35% over the summer when she was in A-fib however may improve now that she is maintaining sinus rhythm.  It still a little early to repeat an echocardiogram.  I would recommend a repeat echo in 3 months and continue on metoprolol and losartan.  If her EF is not improved then we would like to recommend adjusting her medications to more current guideline directed medical therapy for heart failure.  If her EF has resolved then we could continue her current medications.  Blood pressure was elevated today however she said at home is much better controlled.  She did quit smoking over the summer and has maintained that.  Plan follow-up with me in 3 months.  Nelda Marseille, MD, [facility], FACP  [place]  [facility]  Medical Director of the [facility] &  Cardiovascular Risk Reduction  Clinic Diplomate of the American Board of Clinical Lipidology Attending Cardiologist  Direct Dial: [phone]  Fax: [phone]  Website:  [email or web address] Graysin Luczynski 03/13/2022, 1:39 PM

## 2022-03-13 NOTE — Patient Instructions (Signed)
Medication Instructions:  NO CHANGES  *If you need a refill on your cardiac medications before your next appointment, please call your pharmacy*   Testing/Procedures: Your physician has requested that you have an echocardiogram due February 2024. Echocardiography is a painless test that uses sound waves to create images of your heart. It provides your doctor with information about the size and shape of your heart and how well your heart's chambers and valves are working. This procedure takes approximately one hour. There are no restrictions for this procedure. Please do NOT wear cologne, perfume, aftershave, or lotions (deodorant is allowed). Please arrive 15 minutes prior to your appointment time. This test is done at [facility] - [address] - 3rd Floor   Follow-Up: At [facility], you and your health needs are our priority.  As part of our continuing mission to provide you with exceptional heart care, we have created designated Provider Care Teams.  These Care Teams include your primary Cardiologist (physician) and Advanced Practice Providers (APPs -  Physician Assistants and Nurse Practitioners) who all work together to provide you with the care you need, when you need it.  We recommend signing up for the patient portal called "MyChart".  Sign up information is provided on this After Visit Summary.  MyChart is used to connect with patients for Virtual Visits (Telemedicine).  Patients are able to view lab/test results, encounter notes, upcoming appointments, etc.  Non-urgent messages can be sent to your provider as well.   To learn more about what you can do with MyChart, go to [email or web address].    Your next appointment:    3 months with Dr. Rennis Golden

## 2022-05-22 ENCOUNTER — Ambulatory Visit (HOSPITAL_COMMUNITY)
Admission: RE | Admit: 2022-05-22 | Discharge: 2022-05-22 | Disposition: A | Discharge status: Home / Self Care | Payer: Medicare PPO | Source: Ambulatory Visit | Attending: Physician Assistant | Admitting: Physician Assistant

## 2022-05-22 ENCOUNTER — Encounter (HOSPITAL_COMMUNITY): Payer: Self-pay | Admitting: Physician Assistant

## 2022-05-22 VITALS — BP 142/88 | HR 70 | Ht 67.0 in | Wt 224.8 lb

## 2022-05-22 DIAGNOSIS — I5032 Chronic diastolic (congestive) heart failure: Secondary | ICD-10-CM | POA: Insufficient documentation

## 2022-05-22 DIAGNOSIS — I110 Hypertensive heart disease with heart failure: Secondary | ICD-10-CM | POA: Diagnosis not present

## 2022-05-22 DIAGNOSIS — Z7901 Long term (current) use of anticoagulants: Secondary | ICD-10-CM | POA: Insufficient documentation

## 2022-05-22 DIAGNOSIS — Z79899 Other long term (current) drug therapy: Secondary | ICD-10-CM | POA: Diagnosis not present

## 2022-05-22 DIAGNOSIS — I4819 Other persistent atrial fibrillation: Secondary | ICD-10-CM | POA: Insufficient documentation

## 2022-05-22 DIAGNOSIS — E669 Obesity, unspecified: Secondary | ICD-10-CM | POA: Diagnosis not present

## 2022-05-22 DIAGNOSIS — E038 Other specified hypothyroidism: Secondary | ICD-10-CM | POA: Diagnosis not present

## 2022-05-22 DIAGNOSIS — Z87891 Personal history of nicotine dependence: Secondary | ICD-10-CM | POA: Insufficient documentation

## 2022-05-22 DIAGNOSIS — D6869 Other thrombophilia: Secondary | ICD-10-CM | POA: Insufficient documentation

## 2022-05-22 DIAGNOSIS — Z8249 Family history of ischemic heart disease and other diseases of the circulatory system: Secondary | ICD-10-CM | POA: Insufficient documentation

## 2022-05-22 LAB — BASIC METABOLIC PANEL
Anion gap: 11 (ref 5–15)
BUN: 29 mg/dL — ABNORMAL HIGH (ref 8–23)
CO2: 23 mmol/L (ref 22–32)
Calcium: 9.5 mg/dL (ref 8.9–10.3)
Chloride: 102 mmol/L (ref 98–111)
Creatinine, Ser: 1.24 mg/dL — ABNORMAL HIGH (ref 0.44–1.00)
GFR, Estimated: 47 mL/min — ABNORMAL LOW (ref >60)
Glucose, Bld: 110 mg/dL — ABNORMAL HIGH (ref 70–99)
Potassium: 4.1 mmol/L (ref 3.5–5.1)
Sodium: 136 mmol/L (ref 135–145)

## 2022-05-22 LAB — MAGNESIUM: Magnesium: 2.1 mg/dL (ref 1.7–2.4)

## 2022-05-22 NOTE — Progress Notes (Addendum)
Primary Care Physician: Pcp, No Referring Physician: [facility] f/u  Primary cardiologist: Dr Rennis Golden Primary EP: Dr Malen Gauze is a 67 y.o. female with a h/o HTN, smoking, obesity who presented to ED 11/07/21 with several weeks of progressive palpitations, dyspnea on exertion, cough and orthopnea.   In the ER, CXR showed bilateral infiltrates and she was found to be in new onset Afib. She was admitted and started on rate control agents. Echo 7/14 showed EF 30-35%.  She was diuresed and rates were controlled.  She was weaned off O2 and appeared euvolemic off diuretics.  DCCV was attempted but failed.  Given her sbuclinical hypothyroidism, amiodarone was deferred, rates were controlled with metoprolol and she was discharged on metoprolol and  Eliquis.    She is now in the afib clinic for f/u. She states that she has now stopped smoking. She has not had a PCP and has not been to a MD in years. She feels good today. She remains in rate controlled afib. Duration of afib is unknown as she can't tell she is in it. Shortness of breath and  cough is what brought her to the hospital. These symptoms have resolved. She tried to get a f/u with a PCP but was told she would get a call when there was an appointment available. Her weight is staying normal. TSH in the hospital was 35. She is being compliant with eliquis 5 mg bid. On metoprolol for rate control.   F/u in afib clinic 8/16. She  did have f/u for her elevated TSH and thought to represent  subclinical hypothyroidism. She was given a rx for levothyroxine but had not started yet. She remains in rate controlled afib, duration  unknown. She feels ok in afib but  with reduced EF feel that SR needs to be restored. Amiodarone is being avoided 2/2 thyroid issues. Ablation cases are scheduled out to December. I discussed tikosyn with a possible later ablation down the road.  She was in agreement.   F/u afib clinic 01/13/22. She is here for tikosyn admit.  Qtc at 418 ms, no benadryl use. No missed apixaban for at least 3 weeks. General  info re tikosyn admit. She remains in afib at 108 bpm.   F/u in afib clinic, 02/20/22. She is doing well on Tikosyn, staying in SR. QT is stable. She is compliant with Tikosyn. She has stopped smoking and has gained 20 lbs. Her BP here is elevated at 162/86, at home running around 140 systolic. We discussed dietary management  and exercise to lose weight which should help to control her BP better.  Follow up in the AF clinic 05/22/22. Patient reports that she has done well since her last visit. She remains in SR. No bleeding issues on anticoagulation.   Today, she denies symptoms of palpitations, chest pain, shortness of breath, orthopnea, PND, lower extremity edema, dizziness, presyncope, syncope, or neurologic sequela. The patient is tolerating medications without difficulties and is otherwise without complaint today.   Past Medical History:  Diagnosis Date   Atrial fibrillation (HCC)    Bronchitis    Hypertension    Past Surgical History:  Procedure Laterality Date   BREAST REDUCTION SURGERY     CARDIOVERSION N/A 11/11/2021   Procedure: CARDIOVERSION;  Surgeon: Wendall Stade, MD;  Location: [facility] ENDOSCOPY;  Service: Cardiovascular;  Laterality: N/A;   CARDIOVERSION N/A 01/16/2022   Procedure: CARDIOVERSION;  Surgeon: Parke Poisson, MD;  Location: [facility] ENDOSCOPY;  Service:  Cardiovascular;  Laterality: N/A;   OTHER SURGICAL HISTORY     LEAP procedure   TEE WITHOUT CARDIOVERSION N/A 11/11/2021   Procedure: TRANSESOPHAGEAL ECHOCARDIOGRAM (TEE);  Surgeon: Josue Hector, MD;  Location: [facility] ENDOSCOPY;  Service: Cardiovascular;  Laterality: N/A;   TUBAL LIGATION Bilateral     Current Outpatient Medications  Medication Sig Dispense Refill   acetaminophen (TYLENOL) 325 MG tablet Take 2 tablets (650 mg total) by mouth every 6 (six) hours as needed for moderate pain.     apixaban (ELIQUIS) 5 MG TABS tablet Take  1 tablet (5 mg total) by mouth 2 (two) times daily. 60 tablet 3   BIOTIN PO Take 1 tablet by mouth every morning.     dofetilide (TIKOSYN) 500 MCG capsule Take 1 capsule (500 mcg total) by mouth 2 (two) times daily. 180 capsule 1   empagliflozin (JARDIANCE) 25 MG TABS tablet Take 25 mg by mouth daily.     FOLIC ACID PO Take 4 tablets by mouth daily.     levothyroxine (SYNTHROID) 125 MCG tablet Take 125 mcg by mouth daily before breakfast.     losartan (COZAAR) 100 MG tablet Take 100 mg by mouth daily.     metoprolol succinate (TOPROL-XL) 100 MG 24 hr tablet Take 100 mg by mouth daily.     Multiple Vitamin (MULTIVITAMIN WITH MINERALS) TABS tablet Take 1 tablet by mouth every morning.     nystatin cream (MYCOSTATIN) Apply 1 Application topically as needed for dry skin.     Omega-3 Fatty Acids (FISH OIL) 1000 MG CAPS Take 2,000 mg by mouth every morning.     No current facility-administered medications for this encounter.    Allergies  Allergen Reactions   Penicillins Anaphylaxis   Lidocaine Rash    Reaction to lidocaine patch    Social History   Socioeconomic History   Marital status: Widowed    Spouse name: Not on file   Number of children: Not on file   Years of education: Not on file   Highest education level: Not on file  Occupational History   Not on file  Tobacco Use   Smoking status: Former    Packs/day: 1.00    Years: 11.00    Total pack years: 11.00    Types: Cigarettes    Start date: 08/07/2010    Quit date: 11/07/2021    Years since quitting: 0.5   Smokeless tobacco: Not on file   Tobacco comments:    Former smoker 01/23/22  Vaping Use   Vaping Use: Never used  Substance and Sexual Activity   Alcohol use: Yes    Alcohol/week: 2.0 - 3.0 standard drinks of alcohol    Types: 2 - 3 Standard drinks or equivalent per week    Comment: rarely 2-3 times a year 05/22/22   Drug use: Never   Sexual activity: Not Currently  Other Topics Concern   Not on file  Social  History Narrative   Lives with son and daughter in law   Social Determinants of Health   Financial Resource Strain: Not on file  Food Insecurity: No Food Insecurity (01/14/2022)   Hunger Vital Sign    Worried About Running Out of Food in the Last Year: Never true    Ran Out of Food in the Last Year: Never true  Transportation Needs: No Transportation Needs (01/14/2022)   PRAPARE - Hydrologist (Medical): No    Lack of Transportation (Non-Medical): No  Physical  Activity: Not on file  Stress: Not on file  Social Connections: Not on file  Intimate Partner Violence: Not At Risk (01/14/2022)   Humiliation, Afraid, Rape, and Kick questionnaire    Fear of Current or Ex-Partner: No    Emotionally Abused: No    Physically Abused: No    Sexually Abused: No    Family History  Problem Relation Age of Onset   Diabetes Mother    Hypertension Mother    Rheumatic fever Father    Heart disease Father        Secondary to rheumatic fever.    ROS- All systems are reviewed and negative except as per the HPI above  Physical Exam: Vitals:   05/22/22 1423  BP: (!) 142/88  Pulse: 70  Weight: 102 kg  Height: 5\' 7"  (1.702 m)    Wt Readings from Last 3 Encounters:  05/22/22 102 kg  03/13/22 101 kg  02/20/22 100.9 kg    Labs: Lab Results  Component Value Date   NA 138 02/20/2022   K 4.8 02/20/2022   CL 102 02/20/2022   CO2 28 02/20/2022   GLUCOSE 127 (H) 02/20/2022   BUN 19 02/20/2022   CREATININE 1.10 (H) 02/20/2022   CALCIUM 9.4 02/20/2022   MG 2.0 02/20/2022   Lab Results  Component Value Date   INR 1.2 01/15/2022   No results found for: "CHOL", "HDL", "LDLCALC", "TRIG"   GEN- The patient is a well appearing obese female, alert and oriented x 3 today.   HEENT-head normocephalic, atraumatic, sclera clear, conjunctiva pink, hearing intact, trachea midline. Lungs- Clear to ausculation bilaterally, normal work of breathing Heart- Regular rate and  rhythm, no murmurs, rubs or gallops  GI- soft, NT, ND, + BS Extremities- no clubbing, cyanosis, or edema MS- no significant deformity or atrophy Skin- no rash or lesion Psych- euthymic mood, full affect Neuro- strength and sensation are intact   EKG  SR Vent. rate 70 BPM PR interval 150 ms QRS duration 74 ms QT/QTcB 420/453 ms   Echo 11/08/21 1. Left ventricular ejection fraction, by estimation, is 30 to 35%. The left ventricle has moderately decreased function. The left ventricle demonstrates global hypokinesis. The left ventricular internal cavity size was moderately dilated. There is mild left ventricular hypertrophy. Left ventricular diastolic parameters are  indeterminate.   2. Right ventricular systolic function is normal. The right ventricular  size is normal. There is normal pulmonary artery systolic pressure.   3. Left atrial size was mildly dilated.   4. The mitral valve is normal in structure. Mild mitral valve  regurgitation. No evidence of mitral stenosis.   5. The aortic valve is normal in structure. Aortic valve regurgitation is not visualized. No aortic stenosis is present.   6. The inferior vena cava is normal in size with greater than 50%  respiratory variability, suggesting right atrial pressure of 3 mmHg.    CHA2DS2-VASc Score = 4  The patient's score is based upon: CHF History: 1 HTN History: 1 Diabetes History: 0 Stroke History: 0 Vascular Disease History: 0 Age Score: 1 Gender Score: 1       ASSESSMENT AND PLAN: 1. Persistent Atrial Fibrillation (ICD10:  I48.19) The patient's CHA2DS2-VASc score is 4, indicating a 4.8% annual risk of stroke.   S/p tikosyn admit September 2023 Patient appears to be maintaining SR. Continue dofetilide 500 mcg BID. QT stable. Check bmet/mag today. Continue Eliquis 5 mg BID Continue Toprol 75 mg daily  2. Secondary Hypercoagulable State (ICD10:  D68.69) The patient is at significant risk for stroke/thromboembolism  based upon her CHA2DS2-VASc Score of 4.  Continue Apixaban (Eliquis).   3. Chronic HFpEF EF 30-35% Repeat echo scheduled for 06/03/22 Fluid status appears stable today.  4. HTN Stable, no changes today.   Follow up in the AF clinic in 3 months.    [facility] [address] [place], [place] [address] [phone]

## 2022-06-03 ENCOUNTER — Ambulatory Visit (HOSPITAL_COMMUNITY): Payer: Medicare PPO | Attending: Internal Medicine

## 2022-06-03 DIAGNOSIS — I428 Other cardiomyopathies: Secondary | ICD-10-CM | POA: Diagnosis present

## 2022-06-03 DIAGNOSIS — I4819 Other persistent atrial fibrillation: Secondary | ICD-10-CM

## 2022-06-03 LAB — ECHOCARDIOGRAM COMPLETE
Area-P 1/2: 2.97 cm2
S' Lateral: 3.3 cm

## 2022-06-05 ENCOUNTER — Encounter (HOSPITAL_COMMUNITY): Payer: Self-pay | Admitting: *Deleted

## 2022-06-11 ENCOUNTER — Other Ambulatory Visit (HOSPITAL_COMMUNITY): Payer: Self-pay | Admitting: Physician Assistant

## 2022-06-26 ENCOUNTER — Ambulatory Visit: Payer: Medicare PPO | Attending: Internal Medicine | Admitting: Internal Medicine

## 2022-06-26 ENCOUNTER — Encounter: Payer: Self-pay | Admitting: Internal Medicine

## 2022-06-26 VITALS — BP 128/62 | HR 63 | Ht 67.0 in | Wt 226.6 lb

## 2022-06-26 DIAGNOSIS — I428 Other cardiomyopathies: Secondary | ICD-10-CM

## 2022-06-26 DIAGNOSIS — D6869 Other thrombophilia: Secondary | ICD-10-CM | POA: Diagnosis not present

## 2022-06-26 DIAGNOSIS — I4819 Other persistent atrial fibrillation: Secondary | ICD-10-CM | POA: Diagnosis not present

## 2022-06-26 NOTE — Patient Instructions (Signed)
Medication Instructions:  NO CHANGES  *If you need a refill on your cardiac medications before your next appointment, please call your pharmacy*   Follow-Up: At [facility], you and your health needs are our priority.  As part of our continuing mission to provide you with exceptional heart care, we have created designated Provider Care Teams.  These Care Teams include your primary Cardiologist (physician) and Advanced Practice Providers (APPs -  Physician Assistants and Nurse Practitioners) who all work together to provide you with the care you need, when you need it.  We recommend signing up for the patient portal called "MyChart".  Sign up information is provided on this After Visit Summary.  MyChart is used to connect with patients for Virtual Visits (Telemedicine).  Patients are able to view lab/test results, encounter notes, upcoming appointments, etc.  Non-urgent messages can be sent to your provider as well.   To learn more about what you can do with MyChart, go to [email or web address].    Your next appointment:    6-9 months with Dr. Debara Pickett

## 2022-06-26 NOTE — Progress Notes (Signed)
OFFICE NOTE  Chief Complaint:  A-fib/heart failure  Primary Care Physician: Pcp, No  HPI:  Brandi Stanton is a 67 y.o. female with a past medial history significant for hospitalization over the summer with atrial fibrillation and heart failure with LVEF as low as 30 to 35%.  She had multiple cardioversions and eventually was able to achieve sinus rhythm on dofetilide.  This was with the assistance of the A-fib clinic.  She is on losartan and metoprolol for her heart failure.  She denies any worsening shortness of breath, orthopnea, lower extremity edema or other heart failure symptoms.  Her LVEF has not been reassessed since achieving sinus rhythm about 2 months ago.  EKG repeated today shows normal sinus rhythm at 73, QTc 451 ms.  06/26/2022  Brandi Stanton today for follow-up.  In the interim she underwent an echocardiogram which fortunately shows normalization of LV function.  There is an improvement from her last LVEF of 30 to 35% probably related to achieving and maintaining sinus rhythm.  She is done well on dofetilide 500 mcg twice a day.  Her EKG today shows normal sinus rhythm at 63 with a QTc of 444 ms.  PMHx:  Past Medical History:  Diagnosis Date   Atrial fibrillation ([place])    Bronchitis    Hypertension     Past Surgical History:  Procedure Laterality Date   BREAST REDUCTION SURGERY     CARDIOVERSION N/A 11/11/2021   Procedure: CARDIOVERSION;  Surgeon: Josue Hector, MD;  Location: [facility] ENDOSCOPY;  Service: Cardiovascular;  Laterality: N/A;   CARDIOVERSION N/A 01/16/2022   Procedure: CARDIOVERSION;  Surgeon: Elouise Munroe, MD;  Location: [facility] ENDOSCOPY;  Service: Cardiovascular;  Laterality: N/A;   OTHER SURGICAL HISTORY     LEAP procedure   TEE WITHOUT CARDIOVERSION N/A 11/11/2021   Procedure: TRANSESOPHAGEAL ECHOCARDIOGRAM (TEE);  Surgeon: Josue Hector, MD;  Location: [facility] ENDOSCOPY;  Service: Cardiovascular;  Laterality: N/A;   TUBAL LIGATION Bilateral      FAMHx:  Family History  Problem Relation Age of Onset   Diabetes Mother    Hypertension Mother    Rheumatic fever Father    Heart disease Father        Secondary to rheumatic fever.    SOCHx:   reports that she quit smoking about 7 months ago. Her smoking use included cigarettes. She started smoking about 11 years ago. She has a 11.00 pack-year smoking history. She does not have any smokeless tobacco history on file. She reports current alcohol use of about 2.0 - 3.0 standard drinks of alcohol per week. She reports that she does not use drugs.  ALLERGIES:  Allergies  Allergen Reactions   Penicillins Anaphylaxis   Lidocaine Rash    Reaction to lidocaine patch    ROS: Pertinent items noted in HPI and remainder of comprehensive ROS otherwise negative.  HOME MEDS: Current Outpatient Medications on File Prior to Visit  Medication Sig Dispense Refill   acetaminophen (TYLENOL) 325 MG tablet Take 2 tablets (650 mg total) by mouth every 6 (six) hours as needed for moderate pain.     apixaban (ELIQUIS) 5 MG TABS tablet Take 1 tablet (5 mg total) by mouth 2 (two) times daily. 60 tablet 3   BIOTIN PO Take 1 tablet by mouth every morning.     Cholecalciferol (VITAMIN D3) 250 MCG (10000 UT) CAPS Take 1,000 Units by mouth daily.     dofetilide (TIKOSYN) 500 MCG capsule TAKE 1 CAPSULE TWICE A  DAY 180 capsule 1   empagliflozin (JARDIANCE) 25 MG TABS tablet Take 25 mg by mouth daily.     FOLIC ACID PO Take 4 tablets by mouth daily.     levothyroxine (SYNTHROID) 125 MCG tablet Take 125 mcg by mouth daily before breakfast.     losartan (COZAAR) 100 MG tablet Take 100 mg by mouth daily.     metoprolol succinate (TOPROL-XL) 100 MG 24 hr tablet Take 100 mg by mouth daily.     Multiple Vitamin (MULTIVITAMIN WITH MINERALS) TABS tablet Take 1 tablet by mouth every morning.     nystatin cream (MYCOSTATIN) Apply 1 Application topically as needed for dry skin.     Omega-3 Fatty Acids (FISH OIL) 1000  MG CAPS Take 2,000 mg by mouth every morning.     No current facility-administered medications on file prior to visit.    LABS/IMAGING: No results found for this or any previous visit (from the past 48 hour(s)). No results found.  LIPID PANEL: No results found for: "CHOL", "TRIG", "HDL", "CHOLHDL", "VLDL", "LDLCALC", "LDLDIRECT"   WEIGHTS: Wt Readings from Last 3 Encounters:  06/26/22 226 lb 9.6 oz (102.8 kg)  05/22/22 224 lb 12.8 oz (102 kg)  03/13/22 222 lb 9.6 oz (101 kg)    VITALS: BP 128/62 (BP Location: Left Arm, Patient Position: Sitting, Cuff Size: Normal)   Pulse 63   Ht '5\' 7"'$  (1.702 m)   Wt 226 lb 9.6 oz (102.8 kg)   LMP 04/29/2007   SpO2 97%   BMI 35.49 kg/m   EXAM: General appearance: alert and no distress Neck: no carotid bruit, no JVD, and thyroid not enlarged, symmetric, no tenderness/mass/nodules Lungs: clear to auscultation bilaterally Heart: regular rate and rhythm, S1, S2 normal, no murmur, click, rub or gallop Abdomen: soft, non-tender; bowel sounds normal; no masses,  no organomegaly Extremities: extremities normal, atraumatic, no cyanosis or edema Pulses: 2+ and symmetric Skin: Skin color, texture, turgor normal. No rashes or lesions Neurologic: Grossly normal Psych: Pleasant  EKG: Normal sinus rhythm at 63, QTc 444 ms-personally reviewed  ASSESSMENT: Paroxysmal atrial fibrillation, on dofetilide, CHADSVasc score of 4 (female, [id number], CHF) Systolic congestive heart failure, LVEF 30-35%, NYHA Class I symptoms -> LVEF improved up to 60 to 65% (05/2022) Hypertension Former smoker  PLAN: 1.   Brandi Stanton has done well and is maintaining sinus rhythm on dofetilide.  Her QTc is stable.  Her LVEF is normalized.  Blood pressure is well-controlled.  Will continue her current medications.  She has follow-up in the A-fib clinic next month.  Plan otherwise follow-up with me in about 6 to 9 months.  Pixie Casino, MD, [facility], [place] Director of the Advanced Lipid Disorders &  Cardiovascular Risk Reduction Clinic Diplomate of the American Board of Clinical Lipidology Attending Cardiologist  Direct Dial: [phone]  Fax: [phone]  Website:  [email or web address] 06/26/2022, 3:20 PM

## 2022-08-21 ENCOUNTER — Ambulatory Visit (HOSPITAL_COMMUNITY)
Admission: RE | Admit: 2022-08-21 | Discharge: 2022-08-21 | Disposition: A | Discharge status: Home / Self Care | Payer: Medicare PPO | Source: Ambulatory Visit | Attending: Physician Assistant | Admitting: Physician Assistant

## 2022-08-21 ENCOUNTER — Encounter (HOSPITAL_COMMUNITY): Payer: Self-pay | Admitting: Physician Assistant

## 2022-08-21 VITALS — BP 142/88 | HR 59 | Ht 67.0 in | Wt 228.6 lb

## 2022-08-21 DIAGNOSIS — R0600 Dyspnea, unspecified: Secondary | ICD-10-CM | POA: Insufficient documentation

## 2022-08-21 DIAGNOSIS — Z79899 Other long term (current) drug therapy: Secondary | ICD-10-CM

## 2022-08-21 DIAGNOSIS — I4819 Other persistent atrial fibrillation: Secondary | ICD-10-CM | POA: Diagnosis not present

## 2022-08-21 DIAGNOSIS — Z5181 Encounter for therapeutic drug level monitoring: Secondary | ICD-10-CM | POA: Diagnosis not present

## 2022-08-21 DIAGNOSIS — D6869 Other thrombophilia: Secondary | ICD-10-CM | POA: Insufficient documentation

## 2022-08-21 DIAGNOSIS — Z7901 Long term (current) use of anticoagulants: Secondary | ICD-10-CM | POA: Insufficient documentation

## 2022-08-21 DIAGNOSIS — I110 Hypertensive heart disease with heart failure: Secondary | ICD-10-CM | POA: Diagnosis not present

## 2022-08-21 DIAGNOSIS — I5032 Chronic diastolic (congestive) heart failure: Secondary | ICD-10-CM | POA: Diagnosis not present

## 2022-08-21 NOTE — Progress Notes (Signed)
Primary Care Physician: Pcp, No Referring Physician: [facility] f/u  Primary cardiologist: Dr Rennis Golden Primary EP: Dr Malen Gauze is a 67 y.o. female with a h/o HTN, smoking, obesity who presented to ED 11/07/21 with several weeks of progressive palpitations, dyspnea on exertion, cough and orthopnea.   In the ER, CXR showed bilateral infiltrates and she was found to be in new onset Afib. She was admitted and started on rate control agents. Echo 7/14 showed EF 30-35%.  She was diuresed and rates were controlled.  She was weaned off O2 and appeared euvolemic off diuretics.  DCCV was attempted but failed.  Given her sbuclinical hypothyroidism, amiodarone was deferred, rates were controlled with metoprolol and she was discharged on metoprolol and  Eliquis.    She is now in the afib clinic for f/u. She states that she has now stopped smoking. She has not had a PCP and has not been to a MD in years. She feels good today. She remains in rate controlled afib. Duration of afib is unknown as she can't tell she is in it. Shortness of breath and  cough is what brought her to the hospital. These symptoms have resolved. She tried to get a f/u with a PCP but was told she would get a call when there was an appointment available. Her weight is staying normal. TSH in the hospital was 35. She is being compliant with eliquis 5 mg bid. On metoprolol for rate control.   F/u in afib clinic 8/16. She  did have f/u for her elevated TSH and thought to represent  subclinical hypothyroidism. She was given a rx for levothyroxine but had not started yet. She remains in rate controlled afib, duration  unknown. She feels ok in afib but  with reduced EF feel that SR needs to be restored. Amiodarone is being avoided 2/2 thyroid issues. Ablation cases are scheduled out to December. I discussed tikosyn with a possible later ablation down the road.  She was in agreement.   F/u afib clinic 01/13/22. She is here for tikosyn admit.  Qtc at 418 ms, no benadryl use. No missed apixaban for at least 3 weeks. General  info re tikosyn admit. She remains in afib at 108 bpm.   F/u in afib clinic, 02/20/22. She is doing well on Tikosyn, staying in SR. QT is stable. She is compliant with Tikosyn. She has stopped smoking and has gained 20 lbs. Her BP here is elevated at 162/86, at home running around 140 systolic. We discussed dietary management  and exercise to lose weight which should help to control her BP better.  Follow up in the AF clinic 05/22/22. Patient reports that she has done well since her last visit. She remains in SR. No bleeding issues on anticoagulation.   Follow up in the AF clinic 08/21/22. Patient reports that she has done well since her last appointment. She denies any interim symptoms of afib. No bleeding issues on anticoagulation.   Today, she denies symptoms of palpitations, chest pain, shortness of breath, orthopnea, PND, lower extremity edema, dizziness, presyncope, syncope, or neurologic sequela. The patient is tolerating medications without difficulties and is otherwise without complaint today.   Past Medical History:  Diagnosis Date   Atrial fibrillation    Bronchitis    Hypertension    Past Surgical History:  Procedure Laterality Date   BREAST REDUCTION SURGERY     CARDIOVERSION N/A 11/11/2021   Procedure: CARDIOVERSION;  Surgeon: Wendall Stade, MD;  Location: [facility] ENDOSCOPY;  Service: Cardiovascular;  Laterality: N/A;   CARDIOVERSION N/A 01/16/2022   Procedure: CARDIOVERSION;  Surgeon: Parke Poisson, MD;  Location: [facility] ENDOSCOPY;  Service: Cardiovascular;  Laterality: N/A;   OTHER SURGICAL HISTORY     LEAP procedure   TEE WITHOUT CARDIOVERSION N/A 11/11/2021   Procedure: TRANSESOPHAGEAL ECHOCARDIOGRAM (TEE);  Surgeon: Wendall Stade, MD;  Location: [facility] ENDOSCOPY;  Service: Cardiovascular;  Laterality: N/A;   TUBAL LIGATION Bilateral     Current Outpatient Medications  Medication Sig Dispense  Refill   acetaminophen (TYLENOL) 325 MG tablet Take 2 tablets (650 mg total) by mouth every 6 (six) hours as needed for moderate pain.     apixaban (ELIQUIS) 5 MG TABS tablet Take 1 tablet (5 mg total) by mouth 2 (two) times daily. 60 tablet 3   BIOTIN PO Take 1 tablet by mouth every morning.     Cholecalciferol (VITAMIN D3) 250 MCG (10000 UT) CAPS Take 1,000 Units by mouth daily.     dofetilide (TIKOSYN) 500 MCG capsule TAKE 1 CAPSULE TWICE A DAY 180 capsule 1   empagliflozin (JARDIANCE) 25 MG TABS tablet Take 25 mg by mouth daily.     FOLIC ACID PO Take 4 tablets by mouth daily.     levothyroxine (SYNTHROID) 125 MCG tablet Take 125 mcg by mouth daily before breakfast.     losartan (COZAAR) 100 MG tablet Take 100 mg by mouth daily.     metoprolol succinate (TOPROL-XL) 100 MG 24 hr tablet Take 100 mg by mouth daily.     Multiple Vitamin (MULTIVITAMIN WITH MINERALS) TABS tablet Take 1 tablet by mouth every morning.     nystatin cream (MYCOSTATIN) Apply 1 Application topically as needed for dry skin.     Omega-3 Fatty Acids (FISH OIL) 1000 MG CAPS Take 2,000 mg by mouth every morning.     No current facility-administered medications for this encounter.    Allergies  Allergen Reactions   Penicillins Anaphylaxis   Lidocaine Rash    Reaction to lidocaine patch    Social History   Socioeconomic History   Marital status: Widowed    Spouse name: Not on file   Number of children: Not on file   Years of education: Not on file   Highest education level: Not on file  Occupational History   Not on file  Tobacco Use   Smoking status: Former    Packs/day: 1.00    Years: 11.00    Additional pack years: 0.00    Total pack years: 11.00    Types: Cigarettes    Start date: 08/07/2010    Quit date: 11/07/2021    Years since quitting: 0.7   Smokeless tobacco: Not on file   Tobacco comments:    Former smoker 01/23/22  Vaping Use   Vaping Use: Never used  Substance and Sexual Activity    Alcohol use: Yes    Alcohol/week: 2.0 - 3.0 standard drinks of alcohol    Types: 2 - 3 Standard drinks or equivalent per week    Comment: rarely 2-3 times a year 05/22/22   Drug use: Never   Sexual activity: Not Currently  Other Topics Concern   Not on file  Social History Narrative   Lives with son and daughter in law   Social Determinants of Health   Financial Resource Strain: Not on file  Food Insecurity: No Food Insecurity (01/14/2022)   Hunger Vital Sign    Worried About Running Out of Food  in the Last Year: Never true    Ran Out of Food in the Last Year: Never true  Transportation Needs: No Transportation Needs (01/14/2022)   PRAPARE - Administrator, Civil Service (Medical): No    Lack of Transportation (Non-Medical): No  Physical Activity: Not on file  Stress: Not on file  Social Connections: Not on file  Intimate Partner Violence: Not At Risk (01/14/2022)   Humiliation, Afraid, Rape, and Kick questionnaire    Fear of Current or Ex-Partner: No    Emotionally Abused: No    Physically Abused: No    Sexually Abused: No    Family History  Problem Relation Age of Onset   Diabetes Mother    Hypertension Mother    Rheumatic fever Father    Heart disease Father        Secondary to rheumatic fever.    ROS- All systems are reviewed and negative except as per the HPI above  Physical Exam: Vitals:   08/21/22 1351  BP: (!) 142/88  Pulse: (!) 59  Weight: 103.7 kg  Height: 5\' 7"  (1.702 m)     Wt Readings from Last 3 Encounters:  08/21/22 103.7 kg  06/26/22 102.8 kg  05/22/22 102 kg    Labs: Lab Results  Component Value Date   NA 136 05/22/2022   K 4.1 05/22/2022   CL 102 05/22/2022   CO2 23 05/22/2022   GLUCOSE 110 (H) 05/22/2022   BUN 29 (H) 05/22/2022   CREATININE 1.24 (H) 05/22/2022   CALCIUM 9.5 05/22/2022   MG 2.1 05/22/2022   Lab Results  Component Value Date   INR 1.2 01/15/2022   No results found for: "CHOL", "HDL", "LDLCALC",  "TRIG"   GEN- The patient is a well appearing female, alert and oriented x 3 today.   HEENT-head normocephalic, atraumatic, sclera clear, conjunctiva pink, hearing intact, trachea midline. Lungs- Clear to ausculation bilaterally, normal work of breathing Heart- Regular rate and rhythm, no murmurs, rubs or gallops  GI- soft, NT, ND, + BS Extremities- no clubbing, cyanosis, or edema MS- no significant deformity or atrophy Skin- no rash or lesion Psych- euthymic mood, full affect Neuro- strength and sensation are intact   EKG today demonstrates SB Vent. rate 59 BPM PR interval 158 ms QRS duration 68 ms QT/QTcB 440/435 ms   Echo 06/03/22  1. Left ventricular ejection fraction, by estimation, is 60 to 65%. The  left ventricle has normal function. The left ventricle has no regional  wall motion abnormalities. There is mild left ventricular hypertrophy.  Left ventricular diastolic parameters are consistent with Grade I diastolic dysfunction (impaired relaxation).   2. Right ventricular systolic function is normal. The right ventricular  size is normal. Tricuspid regurgitation signal is inadequate for assessing  PA pressure.   3. The mitral valve is grossly normal. Trivial mitral valve  regurgitation.   4. The aortic valve is tricuspid. Aortic valve regurgitation is not  visualized.   5. The inferior vena cava is normal in size with greater than 50%  respiratory variability, suggesting right atrial pressure of 3 mmHg.   Comparison(s): Changes from prior study are noted. 11/08/2021: LVEF 30-35%.    CHA2DS2-VASc Score = 4  The patient's score is based upon: CHF History: 1 HTN History: 1 Diabetes History: 0 Stroke History: 0 Vascular Disease History: 0 Age Score: 1 Gender Score: 1       ASSESSMENT AND PLAN: 1. Persistent Atrial Fibrillation (ICD10:  I48.19) The patient's CHA2DS2-VASc  score is 4, indicating a 4.8% annual risk of stroke.   S/p tikosyn admit September  2023 Patient appears to be maintaining SR.  Continue dofetilide 500 mcg BID. QT stable. Check bmet/mag today.  Continue Eliquis 5 mg BID Continue Toprol 75 mg daily  2. Secondary Hypercoagulable State (ICD10:  D68.69) The patient is at significant risk for stroke/thromboembolism based upon her CHA2DS2-VASc Score of 4.  Continue Apixaban (Eliquis).   3. Chronic HFpEF EF normalized with SR Fluid status appears stable today.  4. HTN Stable, no changes today.   Follow up in the AF clinic in 3-4 months.    Jorja Loa PA-C Afib Clinic [facility] [address] [place], [place] [address] [phone]

## 2022-08-25 ENCOUNTER — Ambulatory Visit (HOSPITAL_COMMUNITY)
Admission: RE | Admit: 2022-08-25 | Discharge: 2022-08-25 | Disposition: A | Discharge status: Home / Self Care | Payer: Medicare PPO | Source: Ambulatory Visit | Attending: Physician Assistant | Admitting: Physician Assistant

## 2022-08-25 DIAGNOSIS — I4819 Other persistent atrial fibrillation: Secondary | ICD-10-CM

## 2022-08-25 DIAGNOSIS — Z79899 Other long term (current) drug therapy: Secondary | ICD-10-CM | POA: Insufficient documentation

## 2022-08-25 DIAGNOSIS — Z5181 Encounter for therapeutic drug level monitoring: Secondary | ICD-10-CM | POA: Insufficient documentation

## 2022-08-25 LAB — BASIC METABOLIC PANEL
Anion gap: 8 (ref 5–15)
BUN: 27 mg/dL — ABNORMAL HIGH (ref 8–23)
CO2: 25 mmol/L (ref 22–32)
Calcium: 9.3 mg/dL (ref 8.9–10.3)
Chloride: 103 mmol/L (ref 98–111)
Creatinine, Ser: 1.21 mg/dL — ABNORMAL HIGH (ref 0.44–1.00)
GFR, Estimated: 49 mL/min — ABNORMAL LOW (ref >60)
Glucose, Bld: 123 mg/dL — ABNORMAL HIGH (ref 70–99)
Potassium: 4.4 mmol/L (ref 3.5–5.1)
Sodium: 136 mmol/L (ref 135–145)

## 2022-08-25 LAB — MAGNESIUM: Magnesium: 2.3 mg/dL (ref 1.7–2.4)

## 2022-12-26 ENCOUNTER — Other Ambulatory Visit (HOSPITAL_COMMUNITY): Payer: Self-pay | Admitting: Physician Assistant

## 2023-01-21 ENCOUNTER — Ambulatory Visit (HOSPITAL_COMMUNITY)
Admission: RE | Admit: 2023-01-21 | Discharge: 2023-01-21 | Disposition: A | Discharge status: Home / Self Care | Payer: Medicare PPO | Source: Ambulatory Visit | Attending: Physician Assistant | Admitting: Physician Assistant

## 2023-01-21 ENCOUNTER — Encounter (HOSPITAL_COMMUNITY): Payer: Self-pay | Admitting: Physician Assistant

## 2023-01-21 VITALS — BP 140/88 | HR 61 | Ht 67.0 in | Wt 229.0 lb

## 2023-01-21 DIAGNOSIS — I110 Hypertensive heart disease with heart failure: Secondary | ICD-10-CM | POA: Diagnosis not present

## 2023-01-21 DIAGNOSIS — Z79899 Other long term (current) drug therapy: Secondary | ICD-10-CM | POA: Insufficient documentation

## 2023-01-21 DIAGNOSIS — D6869 Other thrombophilia: Secondary | ICD-10-CM | POA: Diagnosis not present

## 2023-01-21 DIAGNOSIS — Z5181 Encounter for therapeutic drug level monitoring: Secondary | ICD-10-CM | POA: Diagnosis not present

## 2023-01-21 DIAGNOSIS — I4819 Other persistent atrial fibrillation: Secondary | ICD-10-CM | POA: Insufficient documentation

## 2023-01-21 DIAGNOSIS — Z7901 Long term (current) use of anticoagulants: Secondary | ICD-10-CM | POA: Insufficient documentation

## 2023-01-21 DIAGNOSIS — I5032 Chronic diastolic (congestive) heart failure: Secondary | ICD-10-CM | POA: Diagnosis not present

## 2023-01-21 LAB — BASIC METABOLIC PANEL
Anion gap: 11 (ref 5–15)
BUN: 23 mg/dL (ref 8–23)
CO2: 21 mmol/L — ABNORMAL LOW (ref 22–32)
Calcium: 9.2 mg/dL (ref 8.9–10.3)
Chloride: 104 mmol/L (ref 98–111)
Creatinine, Ser: 1.21 mg/dL — ABNORMAL HIGH (ref 0.44–1.00)
GFR, Estimated: 49 mL/min — ABNORMAL LOW (ref >60)
Glucose, Bld: 120 mg/dL — ABNORMAL HIGH (ref 70–99)
Potassium: 3.9 mmol/L (ref 3.5–5.1)
Sodium: 136 mmol/L (ref 135–145)

## 2023-01-21 LAB — MAGNESIUM: Magnesium: 2.2 mg/dL (ref 1.7–2.4)

## 2023-01-21 NOTE — Progress Notes (Signed)
Primary Care Physician: Pcp, No Referring Physician: [facility] f/u  Primary cardiologist: Dr Rennis Golden Primary EP: Dr Malen Gauze is a 67 y.o. female with a h/o HTN, smoking, obesity, atrial fibrillation who presents to the [facility] for follow up. She was initially on amiodarone but this was discontinued due to thyroid issues. She was loaded on dofetilide 12/2021.    On follow up today, patient reports that she has done well since her last visit. She has not had any interim symptoms of afib. No bleeding issues on anticoagulation.   Today, she denies symptoms of palpitations, chest pain, shortness of breath, orthopnea, PND, lower extremity edema, dizziness, presyncope, syncope, or neurologic sequela. The patient is tolerating medications without difficulties and is otherwise without complaint today.   Past Medical History:  Diagnosis Date   Atrial fibrillation ([facility])    Bronchitis    Hypertension     Current Outpatient Medications  Medication Sig Dispense Refill   acetaminophen (TYLENOL) 325 MG tablet Take 2 tablets (650 mg total) by mouth every 6 (six) hours as needed for moderate pain.     albuterol (VENTOLIN HFA) 108 (90 Base) MCG/ACT inhaler Inhale 1-2 puffs into the lungs every 4 (four) hours as needed for wheezing or shortness of breath.     apixaban (ELIQUIS) 5 MG TABS tablet Take 1 tablet (5 mg total) by mouth 2 (two) times daily. 60 tablet 3   BIOTIN PO Take 1 tablet by mouth every morning.     budesonide-formoterol (SYMBICORT) 160-4.5 MCG/ACT inhaler Inhale 2 puffs into the lungs daily.     chlorhexidine (PERIDEX) 0.12 % solution Use as directed 15 mLs in the mouth or throat 2 (two) times daily.     Cholecalciferol (VITAMIN D3) 250 MCG (10000 UT) CAPS Take 1,000 Units by mouth daily.     dofetilide (TIKOSYN) 500 MCG capsule TAKE 1 CAPSULE TWICE A DAY 180 capsule 2   empagliflozin (JARDIANCE) 25 MG TABS tablet Take 25 mg by mouth daily.      FOLIC ACID PO Take 4 tablets by mouth daily.     levothyroxine (SYNTHROID) 125 MCG tablet Take 125 mcg by mouth daily before breakfast.     losartan (COZAAR) 100 MG tablet Take 100 mg by mouth daily.     metoprolol succinate (TOPROL-XL) 100 MG 24 hr tablet Take 100 mg by mouth daily.     Multiple Vitamin (MULTIVITAMIN WITH MINERALS) TABS tablet Take 1 tablet by mouth every morning.     nystatin cream (MYCOSTATIN) Apply 1 Application topically as needed for dry skin.     Omega-3 Fatty Acids (FISH OIL) 1000 MG CAPS Take 2,000 mg by mouth every morning.     rosuvastatin (CRESTOR) 10 MG tablet Take 10 mg by mouth daily.     No current facility-administered medications for this encounter.    ROS- All systems are reviewed and negative except as per the HPI above  Physical Exam: Vitals:   01/21/23 1402  BP: (!) 140/88  Pulse: 61  Weight: 103.9 kg  Height: 5\' 7"  (1.702 m)    Wt Readings from Last 3 Encounters:  01/21/23 103.9 kg  08/21/22 103.7 kg  06/26/22 102.8 kg    GEN: Well nourished, well developed in no acute distress NECK: No JVD; No carotid bruits CARDIAC: Regular rate and rhythm, no murmurs, rubs, gallops RESPIRATORY:  Clear to auscultation without rales, wheezing or rhonchi  ABDOMEN: Soft, non-tender, non-distended EXTREMITIES:  No edema;  No deformity    EKG today demonstrates SR Vent. rate 61 BPM PR interval 152 ms QRS duration 68 ms QT/QTcB 418/420 ms   Echo 06/03/22  1. Left ventricular ejection fraction, by estimation, is 60 to 65%. The  left ventricle has normal function. The left ventricle has no regional  wall motion abnormalities. There is mild left ventricular hypertrophy.  Left ventricular diastolic parameters are consistent with Grade I diastolic dysfunction (impaired relaxation).   2. Right ventricular systolic function is normal. The right ventricular  size is normal. Tricuspid regurgitation signal is inadequate for assessing  PA pressure.   3. The  mitral valve is grossly normal. Trivial mitral valve  regurgitation.   4. The aortic valve is tricuspid. Aortic valve regurgitation is not  visualized.   5. The inferior vena cava is normal in size with greater than 50%  respiratory variability, suggesting right atrial pressure of 3 mmHg.   Comparison(s): Changes from prior study are noted. 11/08/2021: LVEF 30-35%.    CHA2DS2-VASc Score = 4  The patient's score is based upon: CHF History: 1 HTN History: 1 Diabetes History: 0 Stroke History: 0 Vascular Disease History: 0 Age Score: 1 Gender Score: 1       ASSESSMENT AND PLAN: Persistent Atrial Fibrillation (ICD10:  I48.19) The patient's CHA2DS2-VASc score is 4, indicating a 4.8% annual risk of stroke.   S/p tikosyn admit September 2023 Patient appears to be maintaining SR Continue dofetilide 500 mcg BID, QT stable Check bmet/mag today Continue Eliquis 5 mg BID Continue Toprol 75 mg daily  Secondary Hypercoagulable State (ICD10:  D68.69) The patient is at significant risk for stroke/thromboembolism based upon her CHA2DS2-VASc Score of 4.  Continue Apixaban (Eliquis).   Chronic HFpEF EF normalized with SR Fluid status appears stable  HTN Stable on current regimen   Follow up in the AF clinic in 6 months.    Jorja Loa PA-C Afib Clinic [facility] [address] [place], [place] [address] [phone]

## 2023-07-23 ENCOUNTER — Ambulatory Visit (HOSPITAL_COMMUNITY)
Admission: RE | Admit: 2023-07-23 | Discharge: 2023-07-23 | Disposition: A | Discharge status: Home / Self Care | Payer: Medicare PPO | Source: Ambulatory Visit | Attending: Physician Assistant | Admitting: Physician Assistant

## 2023-07-23 VITALS — BP 144/96 | HR 62 | Ht 67.0 in | Wt 221.0 lb

## 2023-07-23 DIAGNOSIS — Z5181 Encounter for therapeutic drug level monitoring: Secondary | ICD-10-CM

## 2023-07-23 DIAGNOSIS — D6869 Other thrombophilia: Secondary | ICD-10-CM | POA: Diagnosis not present

## 2023-07-23 DIAGNOSIS — I5022 Chronic systolic (congestive) heart failure: Secondary | ICD-10-CM | POA: Insufficient documentation

## 2023-07-23 DIAGNOSIS — I110 Hypertensive heart disease with heart failure: Secondary | ICD-10-CM | POA: Insufficient documentation

## 2023-07-23 DIAGNOSIS — Z79899 Other long term (current) drug therapy: Secondary | ICD-10-CM

## 2023-07-23 DIAGNOSIS — Z7901 Long term (current) use of anticoagulants: Secondary | ICD-10-CM | POA: Diagnosis not present

## 2023-07-23 DIAGNOSIS — I4819 Other persistent atrial fibrillation: Secondary | ICD-10-CM

## 2023-07-23 LAB — BASIC METABOLIC PANEL WITH GFR
Anion gap: 8 (ref 5–15)
BUN: 20 mg/dL (ref 8–23)
CO2: 27 mmol/L (ref 22–32)
Calcium: 9.3 mg/dL (ref 8.9–10.3)
Chloride: 103 mmol/L (ref 98–111)
Creatinine, Ser: 1.19 mg/dL — ABNORMAL HIGH (ref 0.44–1.00)
GFR, Estimated: 49 mL/min — ABNORMAL LOW (ref >60)
Glucose, Bld: 111 mg/dL — ABNORMAL HIGH (ref 70–99)
Potassium: 4.7 mmol/L (ref 3.5–5.1)
Sodium: 138 mmol/L (ref 135–145)

## 2023-07-23 LAB — MAGNESIUM: Magnesium: 2.3 mg/dL (ref 1.7–2.4)

## 2023-07-23 NOTE — Progress Notes (Signed)
 Primary Care Physician: Pcp, No Referring Physician: [facility] f/u  Primary cardiologist: Dr Rennis Golden Primary EP: Dr Malen Gauze is a 68 y.o. female with a h/o HTN, smoking, CHF, obesity, atrial fibrillation who presents to the [facility] for follow up. She was initially on amiodarone but this was discontinued due to thyroid issues. She was loaded on dofetilide 12/2021.    Patient returns for follow up for atrial fibrillation and dofetilide monitoring. She reports that she has done very well since her last visit. She denies any interim symptoms of afib. No bleeding issues on anticoagulation.   Today, he denies symptoms of palpitations, chest pain, shortness of breath, orthopnea, PND, lower extremity edema, dizziness, presyncope, syncope, snoring, daytime somnolence, bleeding, or neurologic sequela. The patient is tolerating medications without difficulties and is otherwise without complaint today.    Past Medical History:  Diagnosis Date   Atrial fibrillation (HCC)    Bronchitis    Hypertension     Current Outpatient Medications  Medication Sig Dispense Refill   acetaminophen (TYLENOL) 325 MG tablet Take 2 tablets (650 mg total) by mouth every 6 (six) hours as needed for moderate pain. (Patient taking differently: Take 650 mg by mouth as needed for moderate pain (pain score 4-6).)     albuterol (VENTOLIN HFA) 108 (90 Base) MCG/ACT inhaler Inhale 1-2 puffs into the lungs every 4 (four) hours as needed for wheezing or shortness of breath.     apixaban (ELIQUIS) 5 MG TABS tablet Take 1 tablet (5 mg total) by mouth 2 (two) times daily. 60 tablet 3   BIOTIN PO Take 1 tablet by mouth every morning.     budesonide-formoterol (SYMBICORT) 160-4.5 MCG/ACT inhaler Inhale 2 puffs into the lungs daily.     chlorhexidine (PERIDEX) 0.12 % solution Use as directed 15 mLs in the mouth or throat 2 (two) times daily.     Cholecalciferol (VITAMIN D3) 250 MCG (10000 UT)  CAPS Take 1,000 Units by mouth daily.     dofetilide (TIKOSYN) 500 MCG capsule TAKE 1 CAPSULE TWICE A DAY 180 capsule 2   empagliflozin (JARDIANCE) 25 MG TABS tablet Take 25 mg by mouth daily.     FOLIC ACID PO Take 4 tablets by mouth daily.     levothyroxine (SYNTHROID) 125 MCG tablet Take 125 mcg by mouth daily before breakfast.     losartan (COZAAR) 100 MG tablet Take 100 mg by mouth daily.     metoprolol succinate (TOPROL-XL) 100 MG 24 hr tablet Take 100 mg by mouth daily.     MOUNJARO 7.5 MG/0.5ML Pen Inject 7.5 mg into the skin once a week.     Multiple Vitamin (MULTIVITAMIN WITH MINERALS) TABS tablet Take 1 tablet by mouth every morning.     nystatin cream (MYCOSTATIN) Apply 1 Application topically as needed for dry skin.     Omega-3 Fatty Acids (FISH OIL) 1000 MG CAPS Take 2,000 mg by mouth every morning.     rosuvastatin (CRESTOR) 10 MG tablet Take 10 mg by mouth daily.     amoxicillin (AMOXIL) 875 MG tablet Take 875 mg by mouth 2 (two) times daily. (Patient not taking: Reported on 07/23/2023)     No current facility-administered medications for this encounter.    ROS- All systems are reviewed and negative except as per the HPI above  Physical Exam: Vitals:   07/23/23 1344  BP: (!) 144/96  Pulse: 62  Weight: 100.2 kg  Height: 5\' 7"  (  1.702 m)    Wt Readings from Last 3 Encounters:  07/23/23 100.2 kg  01/21/23 103.9 kg  08/21/22 103.7 kg    GEN: Well nourished, well developed in no acute distress CARDIAC: Regular rate and rhythm, no murmurs, rubs, gallops RESPIRATORY:  Clear to auscultation without rales, wheezing or rhonchi  ABDOMEN: Soft, non-tender, non-distended EXTREMITIES:  No edema; No deformity    EKG today demonstrates SR Vent. rate 62 BPM PR interval 174 ms QRS duration 68 ms QT/QTcB 402/408 ms   Echo 06/03/22  1. Left ventricular ejection fraction, by estimation, is 60 to 65%. The  left ventricle has normal function. The left ventricle has no regional   wall motion abnormalities. There is mild left ventricular hypertrophy.  Left ventricular diastolic parameters are consistent with Grade I diastolic dysfunction (impaired relaxation).   2. Right ventricular systolic function is normal. The right ventricular  size is normal. Tricuspid regurgitation signal is inadequate for assessing  PA pressure.   3. The mitral valve is grossly normal. Trivial mitral valve  regurgitation.   4. The aortic valve is tricuspid. Aortic valve regurgitation is not  visualized.   5. The inferior vena cava is normal in size with greater than 50%  respiratory variability, suggesting right atrial pressure of 3 mmHg.   Comparison(s): Changes from prior study are noted. 11/08/2021: LVEF 30-35%.    CHA2DS2-VASc Score = 4  The patient's score is based upon: CHF History: 1 HTN History: 1 Diabetes History: 0 Stroke History: 0 Vascular Disease History: 0 Age Score: 1 Gender Score: 1       ASSESSMENT AND PLAN: Persistent Atrial Fibrillation (ICD10:  I48.19) The patient's CHA2DS2-VASc score is 4, indicating a 4.8% annual risk of stroke.   S/p dofetilide admission 12/2021 Patient appears to be maintaining SR Continue dofetilide 500 mcg BID Continue Eliquis 5 mg BID Continue Toprol 75 mg daily  Secondary Hypercoagulable State (ICD10:  D68.69) The patient is at significant risk for stroke/thromboembolism based upon her CHA2DS2-VASc Score of 4.  Continue Apixaban (Eliquis). No bleeding issues.  High Risk Medication Monitoring (ICD 10: Z79.899) QT interval on ECG acceptable for dofetilide monitoring. Check bmet/mag today.      HFrecEF EF normalized with SR.  GDMT per primary cardiology team Fluid status appears stable today  HTN Stable on current regimen   Follow up in the AF clinic in 6 months.    Jorja Loa PA-C [facility] [address] [place], [place] [address] [phone]

## 2023-09-22 ENCOUNTER — Other Ambulatory Visit (HOSPITAL_COMMUNITY): Payer: Self-pay | Admitting: Physician Assistant

## 2024-01-19 ENCOUNTER — Ambulatory Visit (HOSPITAL_COMMUNITY)
Admission: RE | Admit: 2024-01-19 | Discharge: 2024-01-19 | Disposition: A | Discharge status: Home / Self Care | Source: Ambulatory Visit | Attending: Physician Assistant | Admitting: Physician Assistant

## 2024-01-19 VITALS — BP 160/92 | HR 62 | Ht 67.0 in | Wt 210.4 lb

## 2024-01-19 DIAGNOSIS — Z5181 Encounter for therapeutic drug level monitoring: Secondary | ICD-10-CM

## 2024-01-19 DIAGNOSIS — I4891 Unspecified atrial fibrillation: Secondary | ICD-10-CM

## 2024-01-19 DIAGNOSIS — I4819 Other persistent atrial fibrillation: Secondary | ICD-10-CM

## 2024-01-19 DIAGNOSIS — D6869 Other thrombophilia: Secondary | ICD-10-CM

## 2024-01-19 DIAGNOSIS — Z79899 Other long term (current) drug therapy: Secondary | ICD-10-CM | POA: Diagnosis not present

## 2024-01-19 NOTE — Progress Notes (Signed)
 Primary Care Physician: Pcp, No Referring Physician: [facility] f/u  Primary cardiologist: Dr Mona Primary EP: Dr Cindie Channing Brandi Stanton is a 68 y.o. female with a h/o HTN, smoking, CHF, obesity, atrial fibrillation who presents to the [facility] for follow up. She was initially on amiodarone  but this was discontinued due to thyroid issues. She was loaded on dofetilide  12/2021.    Patient returns for follow up for atrial fibrillation and dofetilide  monitoring. She reports that she has done very well since her last visit. She has lost 30 lbs on Mounjaro. She denies any interim symptoms of afib. No bleeding issues on anticoagulation.   Today, she  denies symptoms of palpitations, chest pain, shortness of breath, orthopnea, PND, lower extremity edema, dizziness, presyncope, syncope, snoring, daytime somnolence, bleeding, or neurologic sequela. The patient is tolerating medications without difficulties and is otherwise without complaint today.    Past Medical History:  Diagnosis Date   Atrial fibrillation (HCC)    Bronchitis    Hypertension     Current Outpatient Medications  Medication Sig Dispense Refill   acetaminophen  (TYLENOL ) 325 MG tablet Take 2 tablets (650 mg total) by mouth every 6 (six) hours as needed for moderate pain. (Patient taking differently: Take 650 mg by mouth as needed for moderate pain (pain score 4-6).)     albuterol (VENTOLIN HFA) 108 (90 Base) MCG/ACT inhaler Inhale 1-2 puffs into the lungs every 4 (four) hours as needed for wheezing or shortness of breath.     apixaban  (ELIQUIS ) 5 MG TABS tablet Take 1 tablet (5 mg total) by mouth 2 (two) times daily. 60 tablet 3   BIOTIN PO Take 1 tablet by mouth every morning.     budesonide-formoterol (SYMBICORT) 160-4.5 MCG/ACT inhaler Inhale 2 puffs into the lungs daily. (Patient taking differently: Inhale 2 puffs into the lungs as needed.)     Cholecalciferol (VITAMIN D3) 250 MCG (10000 UT) CAPS  Take 1,000 Units by mouth daily.     dofetilide  (TIKOSYN ) 500 MCG capsule TAKE 1 CAPSULE TWICE A DAY 180 capsule 1   empagliflozin (JARDIANCE) 25 MG TABS tablet Take 25 mg by mouth daily.     FOLIC ACID PO Take 4 tablets by mouth daily.     levothyroxine  (SYNTHROID ) 125 MCG tablet Take 125 mcg by mouth daily before breakfast.     losartan  (COZAAR ) 100 MG tablet Take 100 mg by mouth daily.     metoprolol  succinate (TOPROL -XL) 100 MG 24 hr tablet Take 100 mg by mouth daily.     MOUNJARO 10 MG/0.5ML Pen Inject 10 mg into the skin once a week.     Multiple Vitamin (MULTIVITAMIN WITH MINERALS) TABS tablet Take 1 tablet by mouth every morning.     nystatin cream (MYCOSTATIN) Apply 1 Application topically as needed for dry skin.     Omega-3 Fatty Acids (FISH OIL) 1000 MG CAPS Take 2,000 mg by mouth every morning.     rosuvastatin (CRESTOR) 10 MG tablet Take 10 mg by mouth daily.     amoxicillin (AMOXIL) 875 MG tablet Take 875 mg by mouth 2 (two) times daily. (Patient not taking: Reported on 01/19/2024)     No current facility-administered medications for this encounter.    ROS- All systems are reviewed and negative except as per the HPI above  Physical Exam: Vitals:   01/19/24 1352  BP: (!) 160/92  Pulse: 62  Weight: 95.4 kg  Height: 5' 7 (1.702 m)  Wt Readings from Last 3 Encounters:  01/19/24 95.4 kg  07/23/23 100.2 kg  01/21/23 103.9 kg    GEN: Well nourished, well developed in no acute distress CARDIAC: Regular rate and rhythm, no murmurs, rubs, gallops RESPIRATORY:  Clear to auscultation without rales, wheezing or rhonchi  ABDOMEN: Soft, non-tender, non-distended EXTREMITIES:  No edema; No deformity    EKG today demonstrates SR Vent. rate 62 BPM PR interval 162 ms QRS duration 74 ms QT/QTcB 432/438 ms   Echo 06/03/22  1. Left ventricular ejection fraction, by estimation, is 60 to 65%. The  left ventricle has normal function. The left ventricle has no regional  wall  motion abnormalities. There is mild left ventricular hypertrophy.  Left ventricular diastolic parameters are consistent with Grade I diastolic dysfunction (impaired relaxation).   2. Right ventricular systolic function is normal. The right ventricular  size is normal. Tricuspid regurgitation signal is inadequate for assessing  PA pressure.   3. The mitral valve is grossly normal. Trivial mitral valve  regurgitation.   4. The aortic valve is tricuspid. Aortic valve regurgitation is not  visualized.   5. The inferior vena cava is normal in size with greater than 50%  respiratory variability, suggesting right atrial pressure of 3 mmHg.   Comparison(s): Changes from prior study are noted. 11/08/2021: LVEF 30-35%.    CHA2DS2-VASc Score = 4  The patient's score is based upon: CHF History: 1 HTN History: 1 Diabetes History: 0 Stroke History: 0 Vascular Disease History: 0 Age Score: 1 Gender Score: 1       ASSESSMENT AND PLAN: Persistent Atrial Fibrillation (ICD10:  I48.19) The patient's CHA2DS2-VASc score is 4, indicating a 4.8% annual risk of stroke.   S/p dofetilide  admission 12/2021 Patient appears to be maintaining SR Continue dofetilide  500 mcg BID Continue Eliquis  5 mg BID Continue Toprol  75 mg daily  Secondary Hypercoagulable State (ICD10:  D68.69) The patient is at significant risk for stroke/thromboembolism based upon her CHA2DS2-VASc Score of 4.  Continue Apixaban  (Eliquis ). No bleeding issues.   High Risk Medication Monitoring (ICD 10: [id number]) Patient requires ongoing monitoring for anti-arrhythmic medication which has the potential to cause life threatening arrhythmias. QT interval on ECG acceptable for dofetilide  monitoring. Check bmet/mag today.     HFrecEF EF 60-65%, normalized with SR GDMT per primary cardiology team Fluid status appears stable today  HTN Stable on current regimen   Follow up with Dr Cindie in 6 months. AF clinic in one year.    Daril Kicks PA-C [facility] [address] [place], [place] [address] [phone]

## 2024-01-20 ENCOUNTER — Ambulatory Visit (HOSPITAL_COMMUNITY): Payer: Self-pay | Admitting: Physician Assistant

## 2024-01-20 LAB — MAGNESIUM: Magnesium: 2.3 mg/dL (ref 1.6–2.3)

## 2024-01-20 LAB — BASIC METABOLIC PANEL WITH GFR
BUN/Creatinine Ratio: 20 (ref 12–28)
BUN: 22 mg/dL (ref 8–27)
CO2: 23 mmol/L (ref 20–29)
Calcium: 10 mg/dL (ref 8.7–10.3)
Chloride: 102 mmol/L (ref 96–106)
Creatinine, Ser: 1.12 mg/dL — ABNORMAL HIGH (ref 0.57–1.00)
Glucose: 88 mg/dL (ref 70–99)
Potassium: 4.9 mmol/L (ref 3.5–5.2)
Sodium: 140 mmol/L (ref 134–144)
eGFR: 53 mL/min/1.73 — ABNORMAL LOW (ref >59)

## 2024-03-21 ENCOUNTER — Other Ambulatory Visit (HOSPITAL_COMMUNITY): Payer: Self-pay | Admitting: Physician Assistant

## 2024-07-22 ENCOUNTER — Ambulatory Visit: Admitting: Physician Assistant
